# Patient Record
Sex: Male | Born: 1955 | Race: White | Hispanic: No | Smoking: Never smoker
Health system: Southern US, Community
[De-identification: ages and names within clinical notes are randomized; demographics above are authoritative.]

## PROBLEM LIST (undated history)

## (undated) DIAGNOSIS — G4730 Sleep apnea, unspecified: Secondary | ICD-10-CM

## (undated) DIAGNOSIS — M109 Gout, unspecified: Secondary | ICD-10-CM

## (undated) DIAGNOSIS — C44320 Squamous cell carcinoma of skin of unspecified parts of face: Secondary | ICD-10-CM

## (undated) DIAGNOSIS — I493 Ventricular premature depolarization: Secondary | ICD-10-CM

## (undated) DIAGNOSIS — I10 Essential (primary) hypertension: Secondary | ICD-10-CM

## (undated) DIAGNOSIS — K219 Gastro-esophageal reflux disease without esophagitis: Secondary | ICD-10-CM

## (undated) HISTORY — DX: Squamous cell carcinoma of skin of unspecified parts of face: C44.320

---
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
## 2008-09-22 ENCOUNTER — Inpatient Hospital Stay: Payer: Self-pay | Admitting: Internal Medicine

## 2008-09-22 ENCOUNTER — Other Ambulatory Visit: Payer: Self-pay

## 2010-08-14 ENCOUNTER — Ambulatory Visit: Payer: Self-pay | Admitting: Internal Medicine

## 2020-01-30 ENCOUNTER — Ambulatory Visit: Payer: Medicare PPO | Attending: Internal Medicine

## 2020-01-30 DIAGNOSIS — Z23 Encounter for immunization: Secondary | ICD-10-CM | POA: Insufficient documentation

## 2020-01-30 NOTE — Progress Notes (Signed)
   Covid-19 Vaccination Clinic  Name:  Douglas Daugherty    MRN: [id number] DOB: 07/03/1956  01/30/2020  [name] was observed post Covid-19 immunization for 15 minutes without incidence. He was provided with Vaccine Information Sheet and instruction to access the V-Safe system.   [name] was instructed to call 911 with any severe reactions post vaccine: Marland Kitchen Difficulty breathing  . Swelling of your face and throat  . A fast heartbeat  . A bad rash all over your body  . Dizziness and weakness    Immunizations Administered    Name Date Dose VIS Date Route   Pfizer COVID-19 Vaccine 01/30/2020 10:24 AM 0.3 mL 11/27/2019 Intramuscular   Manufacturer: [place]   Lot: [phone] [id number]   [place]: [id number]

## 2020-02-05 ENCOUNTER — Other Ambulatory Visit: Payer: Self-pay | Admitting: Internal Medicine

## 2020-02-05 DIAGNOSIS — E213 Hyperparathyroidism, unspecified: Secondary | ICD-10-CM

## 2020-02-12 ENCOUNTER — Encounter
Admission: RE | Admit: 2020-02-12 | Discharge: 2020-02-12 | Disposition: A | Discharge status: Home / Self Care | Payer: Medicare PPO | Source: Ambulatory Visit | Attending: Internal Medicine | Admitting: Internal Medicine

## 2020-02-12 ENCOUNTER — Other Ambulatory Visit: Payer: Self-pay

## 2020-02-12 DIAGNOSIS — E213 Hyperparathyroidism, unspecified: Secondary | ICD-10-CM | POA: Diagnosis present

## 2020-02-12 MED ORDER — TECHNETIUM TC 99M SESTAMIBI GENERIC - CARDIOLITE
25.0000 | Freq: Once | INTRAVENOUS | Status: AC | PRN
  Administered 2020-02-12: 12:00:00 24.77 via INTRAVENOUS

## 2020-02-20 ENCOUNTER — Ambulatory Visit: Payer: Medicare PPO | Attending: Internal Medicine

## 2020-02-20 DIAGNOSIS — Z23 Encounter for immunization: Secondary | ICD-10-CM | POA: Insufficient documentation

## 2020-02-20 NOTE — Progress Notes (Signed)
   Covid-19 Vaccination Clinic  Name:  Douglas Daugherty    MRN: [id number] DOB: 1956/06/26  02/20/2020  Mr. Douglas Daugherty was observed post Covid-19 immunization for 15 minutes without incident. He was provided with Vaccine Information Sheet and instruction to access the V-Safe system.   Douglas Daugherty was instructed to call 911 with any severe reactions post vaccine: Marland Kitchen Difficulty breathing  . Swelling of face and throat  . A fast heartbeat  . A bad rash all over body  . Dizziness and weakness   Immunizations Administered    Name Date Dose VIS Date Route   Pfizer COVID-19 Vaccine 02/20/2020  9:31 AM 0.3 mL 11/27/2019 Intramuscular   Manufacturer: [place]   Lot: [id number]   [place]: [id number]

## 2020-03-25 ENCOUNTER — Other Ambulatory Visit
Admission: RE | Admit: 2020-03-25 | Discharge: 2020-03-25 | Disposition: A | Discharge status: Home / Self Care | Payer: Medicare PPO | Source: Ambulatory Visit | Attending: Unknown Physician Specialty | Admitting: Unknown Physician Specialty

## 2020-03-25 DIAGNOSIS — Z20822 Contact with and (suspected) exposure to covid-19: Secondary | ICD-10-CM | POA: Insufficient documentation

## 2020-03-25 DIAGNOSIS — Z01812 Encounter for preprocedural laboratory examination: Secondary | ICD-10-CM | POA: Diagnosis present

## 2020-03-25 LAB — SARS CORONAVIRUS 2 (TAT 6-24 HRS): SARS Coronavirus 2: NEGATIVE

## 2020-03-28 ENCOUNTER — Encounter
Admission: RE | Admit: 2020-03-28 | Discharge: 2020-03-28 | Disposition: A | Discharge status: Home / Self Care | Payer: Medicare PPO | Source: Ambulatory Visit | Attending: Unknown Physician Specialty | Admitting: Unknown Physician Specialty

## 2020-03-28 ENCOUNTER — Other Ambulatory Visit: Payer: Self-pay

## 2020-03-28 HISTORY — DX: Essential (primary) hypertension: I10

## 2020-03-28 HISTORY — DX: Sleep apnea, unspecified: G47.30

## 2020-03-28 HISTORY — DX: Gout, unspecified: M10.9

## 2020-03-28 HISTORY — DX: Ventricular premature depolarization: I49.3

## 2020-03-28 HISTORY — DX: Gastro-esophageal reflux disease without esophagitis: K21.9

## 2020-03-28 NOTE — Patient Instructions (Signed)
Your procedure is scheduled on: Tuesday 03/29/2020 Report to [facility] elevator on left to 2nd floor.  Check in with surgery information desk.) To find out your arrival time, call [phone] 1:00-3:00 PM today  Remember: Instructions that are not followed completely may result in serious medical risk, up to and including death, or upon the discretion of your surgeon and anesthesiologist your surgery may need to be rescheduled.   __x__ 1. Do not eat food (including mints, candies, chewing gum) after midnight the night before your procedure. You may drink clear liquids up to 2 hours before you are scheduled to arrive at the hospital for your procedure.  Do not drink anything within 2 hours of your scheduled arrival to the hospital.  Approved clear liquids:  --Water or Apple juice without pulp  --Clear carbohydrate beverage such as Gatorade or Powerade  --Black Coffee or Clear Tea (No milk, no creamers, do not add anything to the coffee or tea)   __x__ 2. No alcohol or smoking for 24 hours before or after surgery.  __x__ 3. Notify your doctor if there is any change in your medical condition (cold, fever, infections).  __x__ 4. On the morning of surgery brush your teeth with toothpaste and water.  You may rinse your mouth with mouthwash if you wish.  Do not swallow any toothpaste or mouthwash.  __x__ If available, use antibacterial soap such as Dial to shower/bathe on the day of surgery.  Do not wear jewelry, lotions, powders, deodorant or perfumes on the day of surgery. Do not shave below the face/neck 48 hours prior to surgery.  Do not bring valuables to the hospital.  [facility] is not responsible for any belongings or valuables.   Eyeglasses may not be worn into surgery. For patients discharged on the day of surgery, you will NOT be permitted to drive yourself home.  You must have a responsible adult with you for 24 hours after surgery.  __x__ Take  these medicines on the morning of surgery with a SMALL SIP OF WATER:  1. Carvedilol (Coreg)  2. Omeprazole (Prilosec)  3. Allopurinol (Zyloprim)  4. Tylenol if needed  5. Ativan if needed  __x__ [facility] your morning dose of Losartan and Hydrochlorothiazide only on the day of surgery.  No need to stop these ahead of time.   _n/a_ Follow recommendations from Cardiologist, Pulmonologist or PCP regarding stopping blood thinners.  __x__ TODAY UNTIL AFTER SURGERY: Do not take any Anti-inflammatories such as Advil, Ibuprofen, Motrin, Aleve, Naproxen, Naprosyn, BC/Goodies powders or aspirin products. You may continue to take Tylenol as needed.   __x__ TODAY UNTIL AFTER SURGERY: Do not take any over the counter herbal/ nutritional supplements/ vitamins.  RN reviewed instructions with patient via preop phone call.  Patient verbalized understanding.

## 2020-03-29 ENCOUNTER — Encounter: Payer: Self-pay | Admitting: Unknown Physician Specialty

## 2020-03-29 ENCOUNTER — Ambulatory Visit
Admission: RE | Admit: 2020-03-29 | Discharge: 2020-03-29 | Disposition: A | Discharge status: Home / Self Care | Payer: Medicare PPO | Attending: Unknown Physician Specialty | Admitting: Unknown Physician Specialty

## 2020-03-29 ENCOUNTER — Ambulatory Visit: Payer: Medicare PPO | Admitting: Anesthesiology

## 2020-03-29 ENCOUNTER — Encounter
Admission: RE | Disposition: A | Discharge status: Home / Self Care | Payer: Self-pay | Source: Home / Self Care | Attending: Unknown Physician Specialty

## 2020-03-29 DIAGNOSIS — K219 Gastro-esophageal reflux disease without esophagitis: Secondary | ICD-10-CM | POA: Diagnosis not present

## 2020-03-29 DIAGNOSIS — M109 Gout, unspecified: Secondary | ICD-10-CM | POA: Diagnosis not present

## 2020-03-29 DIAGNOSIS — Z881 Allergy status to other antibiotic agents status: Secondary | ICD-10-CM | POA: Insufficient documentation

## 2020-03-29 DIAGNOSIS — E785 Hyperlipidemia, unspecified: Secondary | ICD-10-CM | POA: Insufficient documentation

## 2020-03-29 DIAGNOSIS — Z79899 Other long term (current) drug therapy: Secondary | ICD-10-CM | POA: Insufficient documentation

## 2020-03-29 DIAGNOSIS — D351 Benign neoplasm of parathyroid gland: Secondary | ICD-10-CM | POA: Diagnosis not present

## 2020-03-29 DIAGNOSIS — I10 Essential (primary) hypertension: Secondary | ICD-10-CM | POA: Diagnosis not present

## 2020-03-29 DIAGNOSIS — G4730 Sleep apnea, unspecified: Secondary | ICD-10-CM | POA: Diagnosis not present

## 2020-03-29 DIAGNOSIS — E214 Other specified disorders of parathyroid gland: Secondary | ICD-10-CM | POA: Diagnosis present

## 2020-03-29 HISTORY — PX: PARATHYROIDECTOMY: SHX19

## 2020-03-29 LAB — PARATHYROID HORMONE, INTRAOP (ARMC ONLY)
Parathyroid Hormone: 147 pg/mL — ABNORMAL HIGH (ref 12–88)
Parathyroid Hormone: 34 pg/mL (ref 12–88)

## 2020-03-29 LAB — CBC
HCT: 46.1 % (ref 39.0–52.0)
Hemoglobin: 16.7 g/dL (ref 13.0–17.0)
MCH: 32.9 pg (ref 26.0–34.0)
MCHC: 36.2 g/dL — ABNORMAL HIGH (ref 30.0–36.0)
MCV: 90.9 fL (ref 80.0–100.0)
Platelets: 291 10*3/uL (ref 150–400)
RBC: 5.07 MIL/uL (ref 4.22–5.81)
RDW: 12.8 % (ref 11.5–15.5)
WBC: 8.6 10*3/uL (ref 4.0–10.5)
nRBC: 0 % (ref 0.0–0.2)

## 2020-03-29 LAB — BASIC METABOLIC PANEL
Anion gap: 11 (ref 5–15)
BUN: 16 mg/dL (ref 8–23)
CO2: 26 mmol/L (ref 22–32)
Calcium: 10.3 mg/dL (ref 8.9–10.3)
Chloride: 102 mmol/L (ref 98–111)
Creatinine, Ser: 1.13 mg/dL (ref 0.61–1.24)
GFR calc Af Amer: >60 mL/min (ref >60)
GFR calc non Af Amer: >60 mL/min (ref >60)
Glucose, Bld: 142 mg/dL — ABNORMAL HIGH (ref 70–99)
Potassium: 3.3 mmol/L — ABNORMAL LOW (ref 3.5–5.1)
Sodium: 139 mmol/L (ref 135–145)

## 2020-03-29 SURGERY — PARATHYROIDECTOMY
Anesthesia: General | Laterality: Left

## 2020-03-29 MED ORDER — PROPOFOL 500 MG/50ML IV EMUL
INTRAVENOUS | Status: AC
  Filled 2020-03-29: qty 50

## 2020-03-29 MED ORDER — GLYCOPYRROLATE 0.2 MG/ML IJ SOLN
INTRAMUSCULAR | Status: DC | PRN
  Administered 2020-03-29 (×2): .2 mg via INTRAVENOUS

## 2020-03-29 MED ORDER — DEXAMETHASONE SODIUM PHOSPHATE 10 MG/ML IJ SOLN
INTRAMUSCULAR | Status: DC | PRN
  Administered 2020-03-29: 10 mg via INTRAVENOUS

## 2020-03-29 MED ORDER — LACTATED RINGERS IV SOLN: INTRAVENOUS | Status: DC | PRN

## 2020-03-29 MED ORDER — LIDOCAINE-EPINEPHRINE 1 %-1:100000 IJ SOLN
INTRAMUSCULAR | Status: AC
  Filled 2020-03-29: qty 1

## 2020-03-29 MED ORDER — PROPOFOL 500 MG/50ML IV EMUL
INTRAVENOUS | Status: DC | PRN
  Administered 2020-03-29: 140 ug/kg/min via INTRAVENOUS

## 2020-03-29 MED ORDER — EPHEDRINE SULFATE 50 MG/ML IJ SOLN
INTRAMUSCULAR | Status: DC | PRN
  Administered 2020-03-29: 10 mg via INTRAVENOUS
  Administered 2020-03-29: 15 mg via INTRAVENOUS

## 2020-03-29 MED ORDER — ONDANSETRON HCL 4 MG/2ML IJ SOLN
INTRAMUSCULAR | Status: DC | PRN
  Administered 2020-03-29: 4 mg via INTRAVENOUS

## 2020-03-29 MED ORDER — REMIFENTANIL HCL 1 MG IV SOLR
INTRAVENOUS | Status: AC
  Filled 2020-03-29: qty 1000

## 2020-03-29 MED ORDER — SODIUM CHLORIDE 0.9 % IV SOLN
INTRAVENOUS | Status: DC | PRN
  Administered 2020-03-29: .2 ug/kg/min via INTRAVENOUS

## 2020-03-29 MED ORDER — PHENYLEPHRINE HCL (PRESSORS) 10 MG/ML IV SOLN
INTRAVENOUS | Status: DC | PRN
  Administered 2020-03-29: 100 ug via INTRAVENOUS

## 2020-03-29 MED ORDER — PHENYLEPHRINE HCL (PRESSORS) 10 MG/ML IV SOLN
INTRAVENOUS | Status: DC | PRN
  Administered 2020-03-29 (×2): 100 ug via INTRAVENOUS

## 2020-03-29 MED ORDER — HYDROCODONE-ACETAMINOPHEN 5-300 MG PO TABS: 1.0000 | ORAL_TABLET | ORAL | 0 refills | Status: DC | PRN

## 2020-03-29 MED ORDER — FENTANYL CITRATE (PF) 100 MCG/2ML IJ SOLN
INTRAMUSCULAR | Status: AC
  Filled 2020-03-29: qty 2

## 2020-03-29 MED ORDER — MIDAZOLAM HCL 2 MG/2ML IJ SOLN
INTRAMUSCULAR | Status: AC
  Filled 2020-03-29: qty 2

## 2020-03-29 MED ORDER — LIDOCAINE-EPINEPHRINE 1 %-1:100000 IJ SOLN
INTRAMUSCULAR | Status: DC | PRN
  Administered 2020-03-29: 3 mL

## 2020-03-29 MED ORDER — PROPOFOL 10 MG/ML IV BOLUS
INTRAVENOUS | Status: DC | PRN
  Administered 2020-03-29: 70 mg via INTRAVENOUS
  Administered 2020-03-29: 180 mg via INTRAVENOUS

## 2020-03-29 MED ORDER — PROPOFOL 10 MG/ML IV BOLUS
INTRAVENOUS | Status: AC
  Filled 2020-03-29: qty 20

## 2020-03-29 MED ORDER — FENTANYL CITRATE (PF) 100 MCG/2ML IJ SOLN
INTRAMUSCULAR | Status: DC | PRN
  Administered 2020-03-29: 100 ug via INTRAVENOUS

## 2020-03-29 MED ORDER — LIDOCAINE HCL (CARDIAC) PF 100 MG/5ML IV SOSY
PREFILLED_SYRINGE | INTRAVENOUS | Status: DC | PRN
  Administered 2020-03-29: 100 mg via INTRAVENOUS

## 2020-03-29 MED ORDER — MIDAZOLAM HCL 2 MG/2ML IJ SOLN
INTRAMUSCULAR | Status: DC | PRN
  Administered 2020-03-29 (×2): 1 mg via INTRAVENOUS

## 2020-03-29 MED ORDER — SUCCINYLCHOLINE CHLORIDE 20 MG/ML IJ SOLN
INTRAMUSCULAR | Status: DC | PRN
  Administered 2020-03-29: 160 mg via INTRAVENOUS

## 2020-03-29 MED ORDER — LACTATED RINGERS IV SOLN: INTRAVENOUS | Status: DC

## 2020-03-29 SURGICAL SUPPLY — 45 items
BLADE SURG 15 STRL LF DISP TIS (BLADE) ×1 IMPLANT
BLADE SURG 15 STRL SS (BLADE) ×2
CANISTER SUCT 1200ML W/VALVE (MISCELLANEOUS) ×3 IMPLANT
CNTNR SPEC 2.5X3XGRAD LEK (MISCELLANEOUS) ×1
CONT SPEC 4OZ STER OR WHT (MISCELLANEOUS) ×2
CONTAINER SPEC 2.5X3XGRAD LEK (MISCELLANEOUS) ×1 IMPLANT
COVER WAND RF STERILE (DRAPES) ×3 IMPLANT
DERMABOND ADVANCED (GAUZE/BANDAGES/DRESSINGS) ×2
DERMABOND ADVANCED .7 DNX12 (GAUZE/BANDAGES/DRESSINGS) IMPLANT
DRAPE MAG INST 16X20 L/F (DRAPES) ×3 IMPLANT
DRSG TEGADERM 2-3/8X2-3/4 SM (GAUZE/BANDAGES/DRESSINGS) ×3 IMPLANT
ELECT CAUTERY BLADE TIP 2.5 (TIP) ×3
ELECT LARYNGEAL 6/7 (MISCELLANEOUS)
ELECT LARYNGEAL 8/9 (MISCELLANEOUS) ×3
ELECT REM PT RETURN 9FT ADLT (ELECTROSURGICAL) ×3
ELECTRODE CAUTERY BLDE TIP 2.5 (TIP) ×1 IMPLANT
ELECTRODE LARYNGEAL 6/7 (MISCELLANEOUS) ×1 IMPLANT
ELECTRODE LARYNGEAL 8/9 (MISCELLANEOUS) ×1 IMPLANT
ELECTRODE REM PT RTRN 9FT ADLT (ELECTROSURGICAL) ×1 IMPLANT
FORCEPS JEWEL BIP 4-3/4 STR (INSTRUMENTS) ×3 IMPLANT
GAUZE 4X4 16PLY RFD (DISPOSABLE) ×3 IMPLANT
GLOVE BIO SURGEON STRL SZ7.5 (GLOVE) ×6 IMPLANT
GOWN STRL REUS W/ TWL LRG LVL3 (GOWN DISPOSABLE) ×1 IMPLANT
GOWN STRL REUS W/TWL LRG LVL3 (GOWN DISPOSABLE) ×2
HEMOSTAT SURGICEL 2X3 (HEMOSTASIS) ×3 IMPLANT
HOOK STAY BLUNT/RETRACTOR 5M (MISCELLANEOUS) ×3 IMPLANT
JACKSON PRATT 7MM (INSTRUMENTS) ×3 IMPLANT
KIT TURNOVER KIT A (KITS) ×3 IMPLANT
LABEL OR SOLS (LABEL) ×3 IMPLANT
MARKER SKIN DUAL TIP RULER LAB (MISCELLANEOUS) ×3 IMPLANT
NS IRRIG 500ML POUR BTL (IV SOLUTION) ×3 IMPLANT
PACK HEAD/NECK (MISCELLANEOUS) ×3 IMPLANT
PROBE NEUROSIGN BIPOL (MISCELLANEOUS) ×1 IMPLANT
PROBE NEUROSIGN BIPOLAR (MISCELLANEOUS) ×2
SHEARS HARMONIC 9CM CVD (BLADE) ×3 IMPLANT
SPONGE KITTNER 5P (MISCELLANEOUS) ×3 IMPLANT
STAPLER SKIN PROX 35W (STAPLE) ×3 IMPLANT
SUT ETH BLK MONO 3 0 FS 1 12/B (SUTURE) ×3 IMPLANT
SUT PROLENE 5 0 PS 3 (SUTURE) ×3 IMPLANT
SUT SILK 2 0 (SUTURE) ×2
SUT SILK 2 0 SH (SUTURE) ×6 IMPLANT
SUT SILK 2-0 18XBRD TIE 12 (SUTURE) ×1 IMPLANT
SUT SILK 3 0 (SUTURE) ×2
SUT SILK 3-0 18XBRD TIE 12 (SUTURE) ×1 IMPLANT
SUT VIC AB 4-0 RB1 18 (SUTURE) ×3 IMPLANT

## 2020-03-29 NOTE — Anesthesia Procedure Notes (Signed)
Procedure Name: Intubation Date/Time: 03/29/2020 8:12 AM Performed by: Arita Miss, MD Pre-anesthesia Checklist: Patient identified, Patient being monitored, Timeout performed, Emergency Drugs available and Suction available Patient Re-evaluated:Patient Re-evaluated prior to induction Oxygen Delivery Method: Circle system utilized Preoxygenation: Pre-oxygenation with 100% oxygen Induction Type: IV induction and Rapid sequence Laryngoscope Size: McGraph and 4 Grade View: Grade I Tube type: Oral Tube size: 7.0 mm Number of attempts: 1 Airway Equipment and Method: Stylet (Nerve monitoring sticker applied to ETT and inserted with ENT visualizing insertion) Placement Confirmation: ETT inserted through vocal cords under direct vision,  positive ETCO2 and breath sounds checked- equal and bilateral Secured at: 22 cm Tube secured with: Tape Dental Injury: Teeth and Oropharynx as per pre-operative assessment  Comments: MV not attempted

## 2020-03-29 NOTE — Transfer of Care (Signed)
Immediate Anesthesia Transfer of Care Note  Patient: Douglas Daugherty  Procedure(s) Performed: PARATHYROIDECTOMY (Left )  Patient Location: PACU  Anesthesia Type:General  Level of Consciousness: sedated  Airway & Oxygen Therapy: Patient Spontanous Breathing and Patient connected to face mask oxygen  Post-op Assessment: Report given to RN and Post -op Vital signs reviewed and stable  Post vital signs: Reviewed and stable  Last Vitals:  Vitals Value Taken Time  BP 150/89 03/29/20 0948  Temp 36.6 C 03/29/20 0948  Pulse 81 03/29/20 0948  Resp 16 03/29/20 0948  SpO2 94 % 03/29/20 0948  Vitals shown include unvalidated device data.  Last Pain:  Vitals:   03/29/20 0948  TempSrc:   PainSc: 0-No pain         Complications: No apparent anesthesia complications

## 2020-03-29 NOTE — Anesthesia Preprocedure Evaluation (Signed)
Anesthesia Evaluation  Patient identified by MRN, date of birth, ID band Patient awake    Reviewed: Allergy & Precautions, NPO status , Patient's Chart, lab work & pertinent test results  History of Anesthesia Complications Negative for: history of anesthetic complications  Airway Mallampati: III  TM Distance: >3 FB Neck ROM: Full    Dental no notable dental hx. (+) Teeth Intact, Dental Advisory Given   Pulmonary neg pulmonary ROS, sleep apnea and Continuous Positive Airway Pressure Ventilation , neg COPD, Patient abstained from smoking.Not current smoker,    Pulmonary exam normal breath sounds clear to auscultation       Cardiovascular Exercise Tolerance: Good METShypertension, Pt. on medications (-) CAD and (-) Past MI negative cardio ROS  (-) dysrhythmias  Rhythm:Regular Rate:Normal - Systolic murmurs RBBB and LAFB   Neuro/Psych negative neurological ROS  negative psych ROS   GI/Hepatic GERD  Controlled,(+)     (-) substance abuse  ,   Endo/Other  neg diabetes  Renal/GU negative Renal ROS     Musculoskeletal   Abdominal   Peds  Hematology   Anesthesia Other Findings Past Medical History: No date: GERD (gastroesophageal reflux disease) No date: Gout No date: Hypertension No date: PVC (premature ventricular contraction) No date: Sleep apnea  Reproductive/Obstetrics                             Anesthesia Physical Anesthesia Plan  ASA: III  Anesthesia Plan: General   Post-op Pain Management:    Induction: Intravenous  PONV Risk Score and Plan: 3 and Ondansetron, Dexamethasone and Propofol infusion  Airway Management Planned: Oral ETT and Video Laryngoscope Planned  Additional Equipment: None  Intra-op Plan:   Post-operative Plan: Extubation in OR  Informed Consent: I have reviewed the patients History and Physical, chart, labs and discussed the procedure including the  risks, benefits and alternatives for the proposed anesthesia with the patient or authorized representative who has indicated his/her understanding and acceptance.     Dental advisory given  Plan Discussed with: CRNA and Surgeon  Anesthesia Plan Comments: (Discussed risks of anesthesia with patient, including PONV, sore throat, lip/dental damage. Rare risks discussed as well, such as cardiorespiratory and neurological sequelae. Patient understands.)        Anesthesia Quick Evaluation

## 2020-03-29 NOTE — Op Note (Signed)
03/29/2020  9:41 AM    Kristen Cardinal  [id number]   Pre-Op Dx: parathyroid mass  Post-op Dx: SAME  Proc: Recurrent laryngeal nerve monitoring proximately 1 hour; excision left superior parathyroid adenoma  Surg:  Roena Malady   Assistant: Vought  Anes:  GOT  EBL: Less than 10 cc  Comp: None  Findings: Approximately 1.5 x 2.5 cm left superior pole parathyroid adenoma  Procedure: Eulis was identified in the holding area taken the operating placed in supine position.  He was then intubated with a laryngeal nerve monitoring endotracheal tube which remained on throughout the procedure.  Blood was drawn for intraoperative PTH level at the beginning of the case this was 147.  The neck was gently extended and the anterior neck was prepped and draped sterilely.  Incision line was marked overlying the cricoid cartilage.  A local anesthetic of 1% lidocaine with 1 100,000 epinephrine was used to inject over the incision line a total of 3 cc was used.  With the neck prepped and draped sterilely a 15 blade was used to incise down to and through the platysma muscle.  Hemostasis was achieved using the harmonic and the Bovie cautery.  The strap muscles were identified in the midline and divided.  On the left-hand side the strap muscles were retracted laterally and the left lobe of the thyroid was identified.  There were several small feeding vessels which were divided using the harmonic scalpel.  The gland was gently medialized.  The recurrent laryngeal nerve was identified in the tracheoesophageal groove was stimulated and remained intact throughout the procedure.  A large mass measuring approximately 1.5 x 2.5 cm was identified in the region behind the upper pole of the left thyroid lobe.  This mass was gently dissected free from the surrounding tissue.  There was several small feeding veins which were divided using harmonic scalpel.  This nodule was removed in its entirety and sent for frozen  section.  Blood was then drawn for intraoperative PTH monitoring after 10 minutes following the removal of the nodule.  This result came back 34.  The frozen section came back hypercellular parathyroid tissue consistent with adenoma.  At this point we felt confident the adenoma was removed the wound was copiously irrigated with saline and any small bleeding points were cauterized using the microbipolar.  The recurrent laryngeal nerve was stimulated at the end of the case and remained intact.  With no active bleeding Surgicel was packed in the neurovascular bed.  The muscles were reapproximated midline using 4-0 Vicryl.  The platysma and subcutaneous tissues were closed using 4-0 Vicryl and the skin was closed using Dermabond.  The patient tolerated the procedure well was awakened in the operating take care in stable condition.  Specimen: Left superior pole parathyroid adenoma  Dispo:   Good  Plan: Discharged home follow-up 1 week.  Roena Malady  03/29/2020 9:41 AM

## 2020-03-29 NOTE — Discharge Instructions (Signed)

## 2020-03-29 NOTE — H&P (Signed)
The patient's history has been reviewed, patient examined, no change in status, stable for surgery.  Questions were answered to the patients satisfaction.  

## 2020-03-29 NOTE — Anesthesia Postprocedure Evaluation (Signed)
Anesthesia Post Note  Patient: Douglas Daugherty  Procedure(s) Performed: PARATHYROIDECTOMY (Left )  Patient location during evaluation: PACU Anesthesia Type: General Level of consciousness: awake and alert Pain management: pain level controlled Vital Signs Assessment: post-procedure vital signs reviewed and stable Respiratory status: spontaneous breathing, nonlabored ventilation, respiratory function stable and patient connected to nasal cannula oxygen Cardiovascular status: blood pressure returned to baseline and stable Postop Assessment: no apparent nausea or vomiting Anesthetic complications: no     Last Vitals:  Vitals:   03/29/20 0957 03/29/20 1018  BP: 135/88   Pulse: 83 78  Resp: 16 16  Temp: (!) 36.3 C   SpO2: 96% 95%    Last Pain:  Vitals:   03/29/20 0957  TempSrc: Tympanic  PainSc: 0-No pain                 Arita Miss

## 2020-03-30 LAB — SURGICAL PATHOLOGY

## 2020-08-15 ENCOUNTER — Ambulatory Visit (INDEPENDENT_AMBULATORY_CARE_PROVIDER_SITE_OTHER): Payer: Medicare PPO | Admitting: Internal Medicine

## 2020-08-15 DIAGNOSIS — Z6838 Body mass index (BMI) 38.0-38.9, adult: Secondary | ICD-10-CM | POA: Diagnosis not present

## 2020-08-15 DIAGNOSIS — I10 Essential (primary) hypertension: Secondary | ICD-10-CM | POA: Insufficient documentation

## 2020-08-15 DIAGNOSIS — Z9989 Dependence on other enabling machines and devices: Secondary | ICD-10-CM

## 2020-08-15 DIAGNOSIS — G4733 Obstructive sleep apnea (adult) (pediatric): Secondary | ICD-10-CM | POA: Diagnosis not present

## 2020-08-15 NOTE — Progress Notes (Signed)
[facility] [place], [place] [address]  Pulmonary Sleep Medicine   Office Visit Note  Patient Name: Douglas Daugherty DOB: 1956/01/25 MRN [id number]    Chief Complaint: Obstructive Sleep Apnea visit  Brief History:  Douglas Daugherty is seen today for follow-up. The patient has a 12 year history of sleep apnea. Patient is using PAP nightly.  The patient feels more rested after sleeping with PAP.  The patient reports  benefiting from PAP use. Reported sleepiness is improved and the Epworth Sleepiness Score is 0 out of 24. The patient does not take naps. The patient complains of the following: no complaints  The compliance download shows  compliance with an average use time of 8.6hours. The AHI is 0.9 The patient does not complain of limb movements disrupting sleep.  ROS  General: (-) fever, (-) chills, (-) night sweat Nose and Sinuses: (-) nasal stuffiness or itchiness, (-) postnasal drip, (-) nosebleeds, (-) sinus trouble. Mouth and Throat: (-) sore throat, (-) hoarseness. Neck: (-) swollen glands, (-) enlarged thyroid, (-) neck pain. Respiratory:-cough, - shortness of breath, - wheezing. Neurologic: - numbness, - tingling. Psychiatric: - anxiety, -depression   Current Medication: Outpatient Encounter Medications as of 08/15/2020  Medication Sig  . acetaminophen (TYLENOL) 500 MG tablet Take 1,000 mg by mouth every 6 (six) hours as needed for moderate pain or headache.  . allopurinol (ZYLOPRIM) 100 MG tablet Take 100 mg by mouth daily.  . Carboxymethylcellulose Sodium (ARTIFICIAL TEARS OP) Place 1 drop into both eyes daily as needed (dry eyes).  . carvedilol (COREG) 12.5 MG tablet Take 25 mg by mouth 2 (two) times daily with a meal.   . colchicine 0.6 MG tablet Take 0.6 mg by mouth 2 (two) times daily as needed (gout flare).   . hydrochlorothiazide (HYDRODIURIL) 25 MG tablet Take 25 mg by mouth daily.  Marland Kitchen HYDROcodone-Acetaminophen 5-300 MG TABS Take 1-2 tablets by  mouth every 4 (four) hours as needed.  Marland Kitchen LORazepam (ATIVAN) 0.5 MG tablet Take 0.5 mg by mouth at bedtime as needed for sleep.  Marland Kitchen losartan (COZAAR) 50 MG tablet Take 100 mg by mouth daily.  . Multiple Vitamin (MULTIVITAMIN WITH MINERALS) TABS tablet Take 1 tablet by mouth daily.  . naproxen sodium (ALEVE) 220 MG tablet Take 220 mg by mouth 2 (two) times daily as needed (pain).  Marland Kitchen ofloxacin (OCUFLOX) 0.3 % ophthalmic solution Place 2 drops into both eyes daily as needed (infection).   Marland Kitchen omeprazole (PRILOSEC) 20 MG capsule Take 20 mg by mouth daily.  . prednisoLONE acetate (PRED FORTE) 1 % ophthalmic suspension Place 1 drop into both eyes daily as needed (allergies).   No facility-administered encounter medications on file as of 08/15/2020.    Surgical History: Past Surgical History:  Procedure Laterality Date  . KNEE SURGERY  1974  . PARATHYROIDECTOMY Left 03/29/2020   Procedure: PARATHYROIDECTOMY;  Surgeon: Beverly Gust, MD;  Location: [facility] ORS;  Service: ENT;  Laterality: Left;    Medical History: Past Medical History:  Diagnosis Date  . GERD (gastroesophageal reflux disease)   . Gout   . Hypertension   . PVC (premature ventricular contraction)   . Sleep apnea     Family History: Non contributory to the present illness  Social History: Social History   Socioeconomic History  . Marital status: Widowed    Spouse name: Not on file  . Number of children: Not on file  . Years of education: Not on file  . Highest education level:  Not on file  Occupational History  . Not on file  Tobacco Use  . Smoking status: Never Smoker  . Smokeless tobacco: Never Used  Substance and Sexual Activity  . Alcohol use: Yes    Comment: occasional  . Drug use: Never  . Sexual activity: Not on file  Other Topics Concern  . Not on file  Social History Narrative  . Not on file   Social Determinants of Health   Financial Resource Strain:   . Difficulty of Paying Living Expenses: Not  on file  Food Insecurity:   . Worried About Charity fundraiser in the Last Year: Not on file  . Ran Out of Food in the Last Year: Not on file  Transportation Needs:   . Lack of Transportation (Medical): Not on file  . Lack of Transportation (Non-Medical): Not on file  Physical Activity:   . Days of Exercise per Week: Not on file  . Minutes of Exercise per Session: Not on file  Stress:   . Feeling of Stress : Not on file  Social Connections:   . Frequency of Communication with Friends and Family: Not on file  . Frequency of Social Gatherings with Friends and Family: Not on file  . Attends Religious Services: Not on file  . Active Member of Clubs or Organizations: Not on file  . Attends Archivist Meetings: Not on file  . Marital Status: Not on file  Intimate Partner Violence:   . Fear of Current or Ex-Partner: Not on file  . Emotionally Abused: Not on file  . Physically Abused: Not on file  . Sexually Abused: Not on file    Vital Signs: Blood pressure (!) 173/105, pulse 66, temperature 98.1 F (36.7 C), height 6' (1.829 m), weight 284 lb (128.8 kg), SpO2 98 %.  Examination: General Appearance: The patient is well-developed, well-nourished, and in no distress. Neck Circumference: 45 Skin: Gross inspection of skin unremarkable. Head: normocephalic, no gross deformities. Eyes: no gross deformities noted. ENT: ears appear grossly normal Neurologic: Alert and oriented. No involuntary movements.    EPWORTH SLEEPINESS SCALE:  Scale:  (0)= no chance of dozing; (1)= slight chance of dozing; (2)= moderate chance of dozing; (3)= high chance of dozing  Chance  Situtation    Sitting and reading: 0    Watching TV: 0    Sitting Inactive in public: 0    As a passenger in car: 0     Lying down to rest: 0    Sitting and talking: 0    Sitting quielty after lunch: 0    In a car, stopped in traffic: 0   TOTAL SCORE:   0 out of 24    SLEEP STUDIES:  1. PSG  10/07/08 AHI 76 SpO89min80%   CPAP COMPLIANCE DATA:  Date Range:08/12/19-08/10/20  Average Daily Use: 8.6 hours  Median Use: 8.7  Compliance for > 4 Hours: 100% AHI: 0.9respiratory events per hour  Days Use 365/365  Mask Leak: 11.2  95th Percentile Pressure: 15.5        LABS: No results found for this or any previous visit (from the past 2160 hour(s)).  Radiology: No results found.  Assessment and Plan: Patient Active Problem List   Diagnosis Date Noted  . OSA on CPAP 08/15/2020  . Hypertension 08/15/2020  . BMI 38.0-38.9,adult 08/15/2020    The patient does tolerate PAP and reports significant benefit from PAP use. He sleeps well and has no issues with CPAP.The patient  was reminded how to clean  CPAP. The patient was also counselled on watching diet and increasing daily exercise. The compliance is excellent. The AHI is 0.9.   1. OSA continue excellent compliance and care for machine. 2. Obesity- increase regular exercise 3. Hypertension-Elevated BP reading today. Encouraged him to monitor his BP at home and to follow-up with PCP as advised.  General Counseling: I have discussed the findings of the evaluation and examination with Lanny Hurst.  I have also discussed any further diagnostic evaluation thatmay be needed or ordered today. Joahan verbalizes understanding of the findings of todays visit. We also reviewed his medications today and discussed drug interactions and side effects including but not limited excessive drowsiness and altered mental states. We also discussed that there is always a risk not just to him but also people around him. he has been encouraged to call the office with any questions or concerns that should arise related to todays visit.     This patient was seen by Theodoro Grist AGNP-C in Collaboration with Dr. Devona Konig as a part of collaborative care agreement.   I have personally obtained a history, examined the patient, evaluated laboratory and  imaging results, formulated the assessment and plan and placed orders.   Richelle Ito Saunders Glance, PhD, FAASM  Diplomate, American Board of Sleep Medicine    Allyne Gee, MD [facility] Diplomate ABMS Pulmonary and Critical Care Medicine Sleep medicine

## 2020-08-15 NOTE — Progress Notes (Deleted)
[facility] [place], [place] [address]  Pulmonary Sleep Medicine   Office Visit Note  Patient Name: CHEY CHO DOB: 02/16/1956 MRN [id number]    Chief Complaint: Obstructive Sleep Apnea visit  Brief History:  Naithen is seen today for *** The patient has a *** history of sleep apnea. Patient *** using PAP nightly.  The patient feels *** after sleeping with PAP.  The patient reports *** from PAP use. Reported sleepiness is  *** and the Epworth Sleepiness Score is *** out of 24. The patient *** take naps. The patient complains of the following: ***  The compliance download shows  compliance with an average use time of *** hours. The AHI is ***  The patient *** of limb movements disrupting sleep.  ROS  General: (-) fever, (-) chills, (-) night sweat Nose and Sinuses: (-) nasal stuffiness or itchiness, (-) postnasal drip, (-) nosebleeds, (-) sinus trouble. Mouth and Throat: (-) sore throat, (-) hoarseness. Neck: (-) swollen glands, (-) enlarged thyroid, (-) neck pain. Respiratory: *** cough, *** shortness of breath, *** wheezing. Neurologic: *** numbness, *** tingling. Psychiatric: *** anxiety, *** depression   Current Medication: Outpatient Encounter Medications as of 08/15/2020  Medication Sig  . acetaminophen (TYLENOL) 500 MG tablet Take 1,000 mg by mouth every 6 (six) hours as needed for moderate pain or headache.  . allopurinol (ZYLOPRIM) 100 MG tablet Take 100 mg by mouth daily.  . Carboxymethylcellulose Sodium (ARTIFICIAL TEARS OP) Place 1 drop into both eyes daily as needed (dry eyes).  . carvedilol (COREG) 12.5 MG tablet Take 25 mg by mouth 2 (two) times daily with a meal.   . colchicine 0.6 MG tablet Take 0.6 mg by mouth 2 (two) times daily as needed (gout flare).   . hydrochlorothiazide (HYDRODIURIL) 25 MG tablet Take 25 mg by mouth daily.  Marland Kitchen HYDROcodone-Acetaminophen 5-300 MG TABS Take 1-2 tablets by mouth every 4 (four) hours as needed.  Marland Kitchen  LORazepam (ATIVAN) 0.5 MG tablet Take 0.5 mg by mouth at bedtime as needed for sleep.  Marland Kitchen losartan (COZAAR) 50 MG tablet Take 100 mg by mouth daily.  . Multiple Vitamin (MULTIVITAMIN WITH MINERALS) TABS tablet Take 1 tablet by mouth daily.  . naproxen sodium (ALEVE) 220 MG tablet Take 220 mg by mouth 2 (two) times daily as needed (pain).  Marland Kitchen ofloxacin (OCUFLOX) 0.3 % ophthalmic solution Place 2 drops into both eyes daily as needed (infection).   Marland Kitchen omeprazole (PRILOSEC) 20 MG capsule Take 20 mg by mouth daily.  . prednisoLONE acetate (PRED FORTE) 1 % ophthalmic suspension Place 1 drop into both eyes daily as needed (allergies).   No facility-administered encounter medications on file as of 08/15/2020.    Surgical History: Past Surgical History:  Procedure Laterality Date  . KNEE SURGERY  1974  . PARATHYROIDECTOMY Left 03/29/2020   Procedure: PARATHYROIDECTOMY;  Surgeon: Beverly Gust, MD;  Location: [facility] ORS;  Service: ENT;  Laterality: Left;    Medical History: Past Medical History:  Diagnosis Date  . GERD (gastroesophageal reflux disease)   . Gout   . Hypertension   . PVC (premature ventricular contraction)   . Sleep apnea     Family History: Non contributory to the present illness  Social History: Social History   Socioeconomic History  . Marital status: Widowed    Spouse name: Not on file  . Number of children: Not on file  . Years of education: Not on file  . Highest education level: Not  on file  Occupational History  . Not on file  Tobacco Use  . Smoking status: Never Smoker  . Smokeless tobacco: Never Used  Substance and Sexual Activity  . Alcohol use: Yes    Comment: occasional  . Drug use: Never  . Sexual activity: Not on file  Other Topics Concern  . Not on file  Social History Narrative  . Not on file   Social Determinants of Health   Financial Resource Strain:   . Difficulty of Paying Living Expenses: Not on file  Food Insecurity:   . Worried  About Charity fundraiser in the Last Year: Not on file  . Ran Out of Food in the Last Year: Not on file  Transportation Needs:   . Lack of Transportation (Medical): Not on file  . Lack of Transportation (Non-Medical): Not on file  Physical Activity:   . Days of Exercise per Week: Not on file  . Minutes of Exercise per Session: Not on file  Stress:   . Feeling of Stress : Not on file  Social Connections:   . Frequency of Communication with Friends and Family: Not on file  . Frequency of Social Gatherings with Friends and Family: Not on file  . Attends Religious Services: Not on file  . Active Member of Clubs or Organizations: Not on file  . Attends Archivist Meetings: Not on file  . Marital Status: Not on file  Intimate Partner Violence:   . Fear of Current or Ex-Partner: Not on file  . Emotionally Abused: Not on file  . Physically Abused: Not on file  . Sexually Abused: Not on file    Vital Signs: Blood pressure (!) 173/105, pulse 66, temperature 98.1 F (36.7 C), height 6' (1.829 m), weight 284 lb (128.8 kg), SpO2 98 %.  Examination: General Appearance: The patient is well-developed, well-nourished, and in no distress. Neck Circumference: *** Skin: Gross inspection of skin unremarkable. Head: normocephalic, no gross deformities. Eyes: no gross deformities noted. ENT: ears appear grossly normal Neurologic: Alert and oriented. No involuntary movements.    EPWORTH SLEEPINESS SCALE:  Scale:  (0)= no chance of dozing; (1)= slight chance of dozing; (2)= moderate chance of dozing; (3)= high chance of dozing  Chance  Situtation    Sitting and reading: ***    Watching TV: ***    Sitting Inactive in public: ***    As a passenger in car: ***      Lying down to rest: ***    Sitting and talking: ***    Sitting quielty after lunch: ***    In a car, stopped in traffic: ***   TOTAL SCORE:   *** out of 24    SLEEP STUDIES:  1. ***   CPAP COMPLIANCE  DATA:  Date Range: ***  Average Daily Use: *** hours  Median Use: ***  Compliance for > 4 Hours: *** days  AHI: *** respiratory events per hour  Days Used: ***  Mask Leak: ***  95th Percentile Pressure: ***         LABS: No results found for this or any previous visit (from the past 2160 hour(s)).  Radiology: No results found.  No results found.  No results found.    Assessment and Plan: There are no problems to display for this patient.     The patient *** tolerate PAP and reports *** benefit from PAP use. The patient was reminded how to *** and advised to ***. The patient  was also counselled on ***. The compliance is ***. The AHI is ***.   1. ***  General Counseling: I have discussed the findings of the evaluation and examination with Lanny Hurst.  I have also discussed any further diagnostic evaluation thatmay be needed or ordered today. Osiah verbalizes understanding of the findings of todays visit. We also reviewed his medications today and discussed drug interactions and side effects including but not limited excessive drowsiness and altered mental states. We also discussed that there is always a risk not just to him but also people around him. he has been encouraged to call the office with any questions or concerns that should arise related to todays visit.  No orders of the defined types were placed in this encounter.       I have personally obtained a history, examined the patient, evaluated laboratory and imaging results, formulated the assessment and plan and placed orders.   Richelle Ito Saunders Glance, PhD, FAASM  Diplomate, American Board of Sleep Medicine    Allyne Gee, MD [facility] Diplomate ABMS Pulmonary and Critical Care Medicine Sleep medicine

## 2020-08-15 NOTE — Patient Instructions (Signed)

## 2021-07-03 IMAGING — NM NM PARATHYROID W/ SPECT / CT
1 series · 12 of 14 positions shown, 15 images · non-contrast
Comparison: None

CLINICAL DATA: Hypercalcemia, hyperparathyroidism

EXAM:
NM PARATHYROID SCINTIGRAPHY AND SPECT IMAGING
TECHNIQUE: Following intravenous administration of radiopharmaceutical, early
and 2-hour delayed planar images were obtained in the anterior
projection. Delayed triplanar SPECT images were also obtained at 2
hours. Concomitant CT images were obtained. Fused data sets are
evaluated in 3 planes.
RADIOPHARMACEUTICALS:  24.77 mCi Fc-LLm Sestamibi IV

[Series 3: 3d parathroid 1.25 b31s · axial · 0.98mm/px · z∈[+1660,+1900]mm · 12 of 356 slices shown, 15 images]
[im 28/356  soft-tissue]
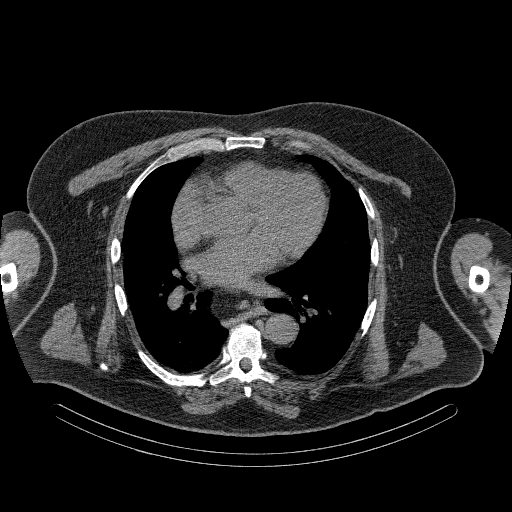
[im 28/356  bone]
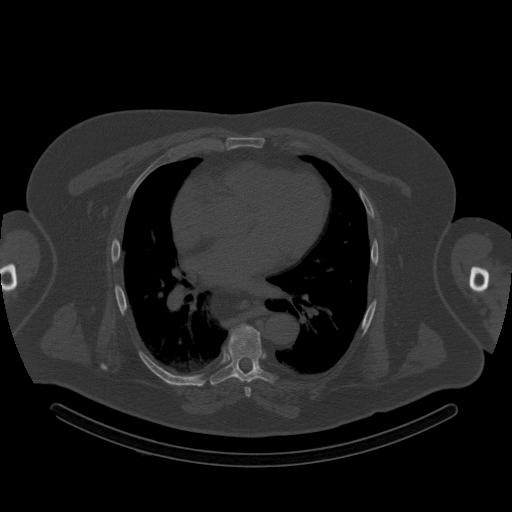
[im 55/356  bone]
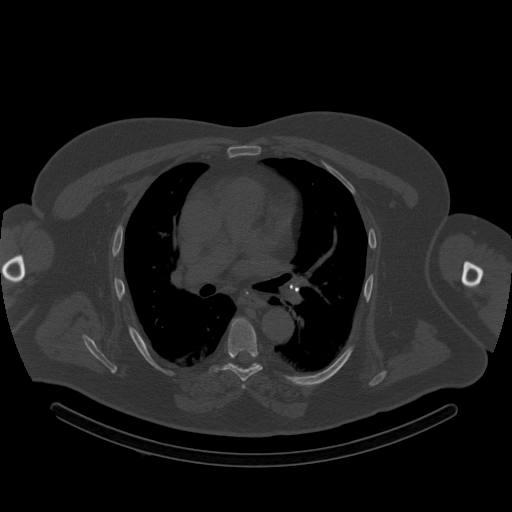
[im 82/356  bone]
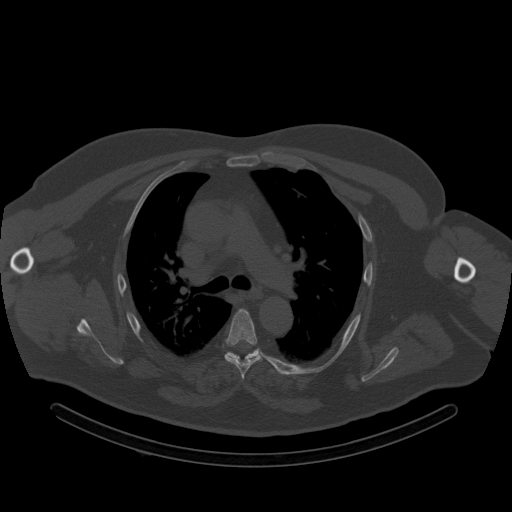
[im 110/356  bone]
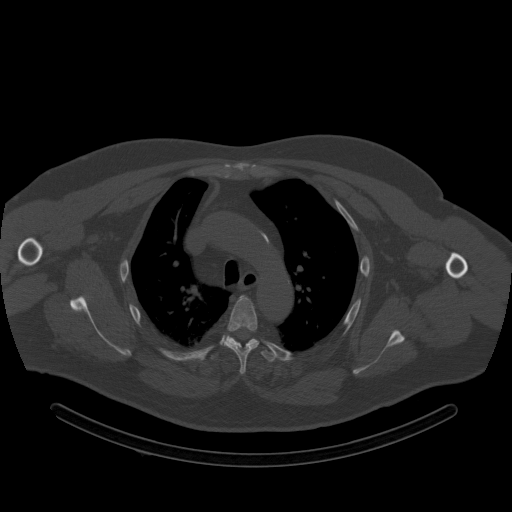
[im 137/356  soft-tissue]
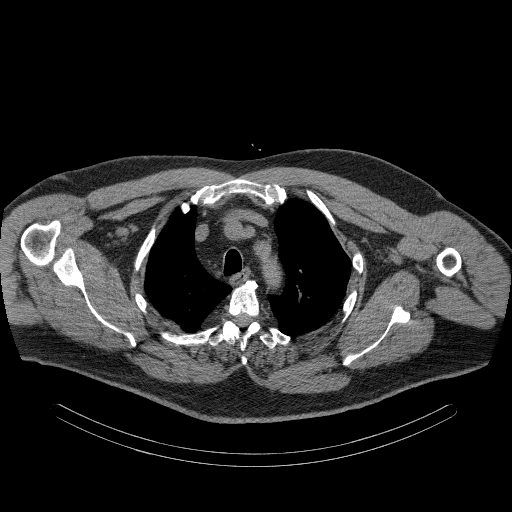
[im 137/356  bone]
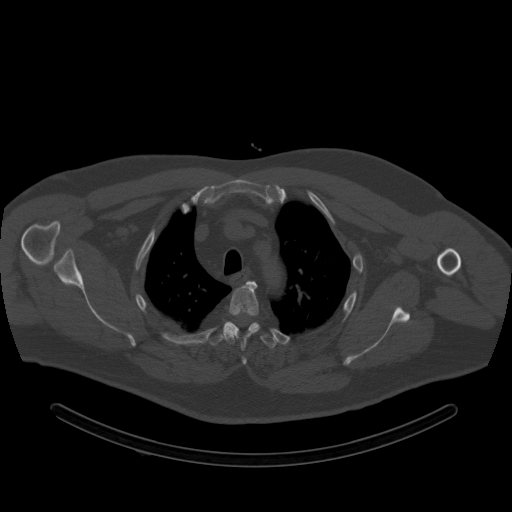
[im 164/356  bone]
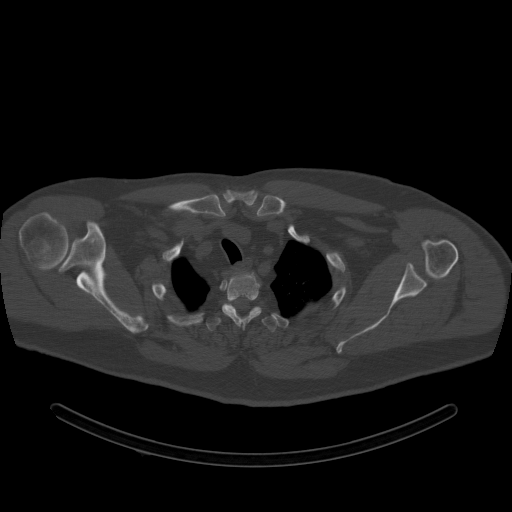
[im 192/356  bone]
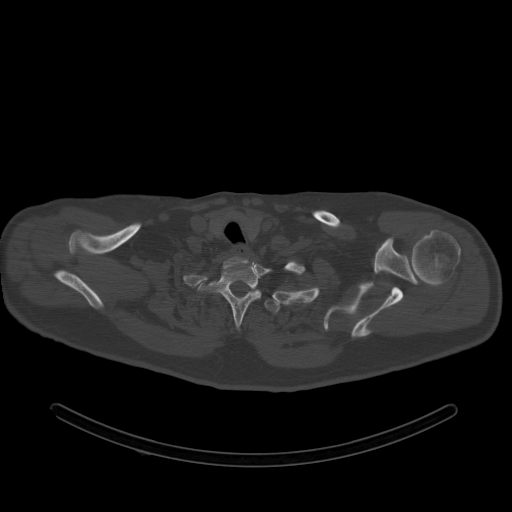
[im 219/356  bone]
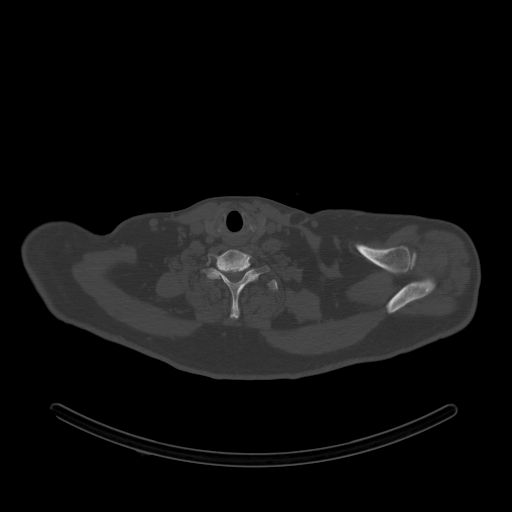
[im 246/356  soft-tissue]
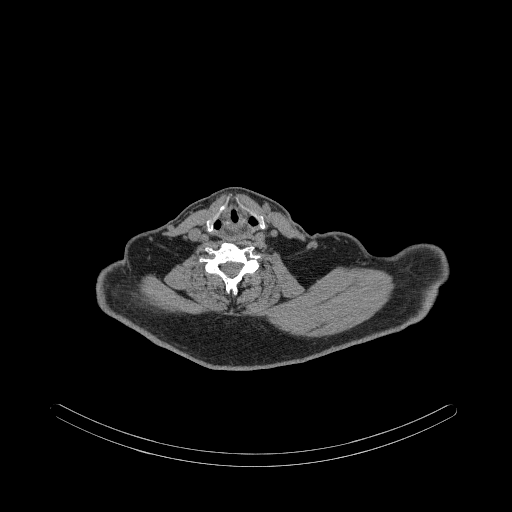
[im 246/356  bone]
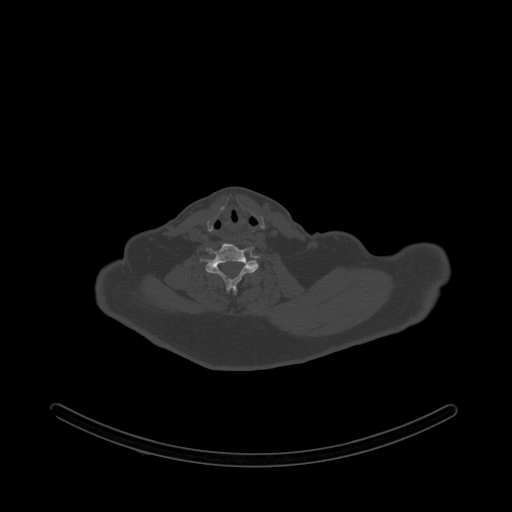
[im 274/356  bone]
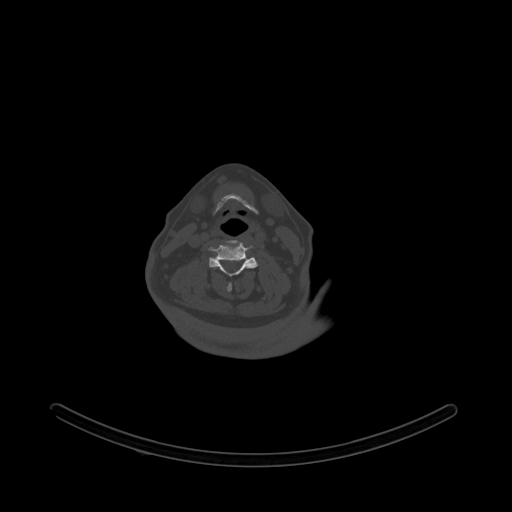
[im 301/356  bone]
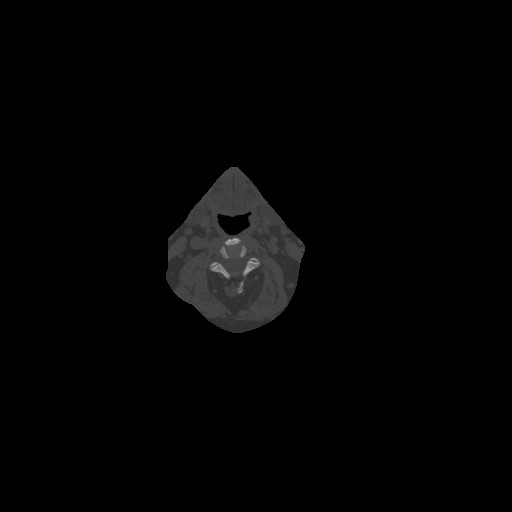
[im 328/356  bone]
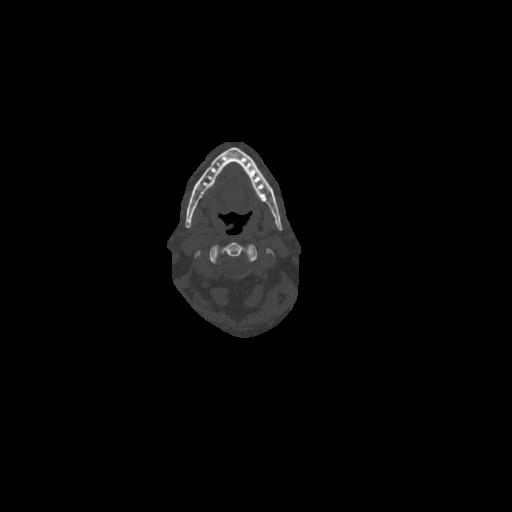

[12 of 14 positions shown; findings below may reference images not displayed]

FINDINGS: Planar imaging: Asymmetric tracer distribution throughout LEFT
thyroid lobe versus RIGHT. Normal washout of tracer from thyroid
tissue with a focal area of abnormal sestamibi retention at the mid
LEFT thyroid lobe.

SPECT imaging: Focal abnormal sestamibi retention identified mid to
upper LEFT thyroid lobe. This corresponds to a small soft tissue
nodule measuring 11 x 4 mm in axial dimensions and extending
approximately 25 mm length. Finding is consistent with a parathyroid
adenoma. No abnormal tracer localization at the remaining
parathyroid glands. No ectopic localization of tracer in the
mediastinum.
IMPRESSION: Abnormal parathyroid scan demonstrating abnormal sestamibi retention
at a 11 x 4 x ~25 mm nodule posterior to the upper/mid LEFT
thyroid lobe consistent with a LEFT upper parathyroid adenoma.

## 2021-08-25 NOTE — Progress Notes (Signed)
[facility] [place], [place] [address]  Pulmonary Sleep Medicine   Office Visit Note  Patient Name: Douglas Daugherty DOB: 27-Jun-1956 MRN [id number]    Chief Complaint: Obstructive Sleep Apnea visit  Brief History:  Sherrick is seen today for one year follow uo The patient has a 13 years history of sleep apnea. Patient is using PAP nightly.  The patient feels better after sleeping with PAP.  The patient reports benefiting from from PAP use. Reported sleepiness is  eliminated and the Epworth Sleepiness Score is 1 out of 24. The patient naps take naps. The patient has no complaints or concerns at this time.  The compliance download shows  compliance with an average use time of 8:37 hours @ 100%. The AHI is 1.3  The patient does not complain of limb movements disrupting sleep.  ROS  General: (-) fever, (-) chills, (-) night sweat Nose and Sinuses: (-) nasal stuffiness or itchiness, (-) postnasal drip, (-) nosebleeds, (-) sinus trouble. Mouth and Throat: (-) sore throat, (-) hoarseness. Neck: (-) swollen glands, (-) enlarged thyroid, (-) neck pain. Respiratory: - cough, - shortness of breath, - wheezing. Neurologic: - numbness, - tingling. Psychiatric: - anxiety, - depression   Current Medication: Outpatient Encounter Medications as of 08/28/2021  Medication Sig   acetaminophen (TYLENOL) 500 MG tablet Take 1,000 mg by mouth every 6 (six) hours as needed for moderate pain or headache.   allopurinol (ZYLOPRIM) 100 MG tablet Take 100 mg by mouth daily.   Carboxymethylcellulose Sodium (ARTIFICIAL TEARS OP) Place 1 drop into both eyes daily as needed (dry eyes).   carvedilol (COREG) 12.5 MG tablet Take 25 mg by mouth 2 (two) times daily with a meal.    carvedilol (COREG) 25 MG tablet Take 25 mg by mouth 2 (two) times daily.   colchicine 0.6 MG tablet Take 0.6 mg by mouth 2 (two) times daily as needed (gout flare).    hydrochlorothiazide (HYDRODIURIL) 25 MG tablet  Take 25 mg by mouth daily.   HYDROcodone-Acetaminophen 5-300 MG TABS Take 1-2 tablets by mouth every 4 (four) hours as needed.   LORazepam (ATIVAN) 0.5 MG tablet Take 0.5 mg by mouth at bedtime as needed for sleep.   losartan (COZAAR) 50 MG tablet Take 100 mg by mouth daily.   Multiple Vitamin (MULTIVITAMIN WITH MINERALS) TABS tablet Take 1 tablet by mouth daily.   naproxen sodium (ALEVE) 220 MG tablet Take 220 mg by mouth 2 (two) times daily as needed (pain).   ofloxacin (OCUFLOX) 0.3 % ophthalmic solution Place 2 drops into both eyes daily as needed (infection).    omeprazole (PRILOSEC) 20 MG capsule Take 20 mg by mouth daily.   prednisoLONE acetate (PRED FORTE) 1 % ophthalmic suspension Place 1 drop into both eyes daily as needed (allergies).   No facility-administered encounter medications on file as of 08/28/2021.    Surgical History: Past Surgical History:  Procedure Laterality Date   KNEE SURGERY  1974   PARATHYROIDECTOMY Left 03/29/2020   Procedure: PARATHYROIDECTOMY;  Surgeon: Beverly Gust, MD;  Location: [facility] ORS;  Service: ENT;  Laterality: Left;    Medical History: Past Medical History:  Diagnosis Date   GERD (gastroesophageal reflux disease)    Gout    Hypertension    PVC (premature ventricular contraction)    Sleep apnea     Family History: Non contributory to the present illness  Social History: Social History   Socioeconomic History   Marital status: Widowed  Spouse name: Not on file   Number of children: Not on file   Years of education: Not on file   Highest education level: Not on file  Occupational History   Not on file  Tobacco Use   Smoking status: Never   Smokeless tobacco: Never  Substance and Sexual Activity   Alcohol use: Yes    Comment: occasional   Drug use: Never   Sexual activity: Not on file  Other Topics Concern   Not on file  Social History Narrative   Not on file   Social Determinants of Health   Financial Resource  Strain: Not on file  Food Insecurity: Not on file  Transportation Needs: Not on file  Physical Activity: Not on file  Stress: Not on file  Social Connections: Not on file  Intimate Partner Violence: Not on file    Vital Signs: Blood pressure (!) 197/63, pulse 67, resp. rate 18, height 6' (1.829 m), weight 270 lb 3.2 oz (122.6 kg), SpO2 99 %.  Examination: General Appearance: The patient is well-developed, well-nourished, and in no distress. Neck Circumference: 45 cm Skin: Gross inspection of skin unremarkable. Head: normocephalic, no gross deformities. Eyes: no gross deformities noted. ENT: ears appear grossly normal Neurologic: Alert and oriented. No involuntary movements.    EPWORTH SLEEPINESS SCALE:  Scale:  (0)= no chance of dozing; (1)= slight chance of dozing; (2)= moderate chance of dozing; (3)= high chance of dozing  Chance  Situtation    Sitting and reading: 0    Watching TV: 0    Sitting Inactive in public: 0    As a passenger in car: 1      Lying down to rest: 0    Sitting and talking: 0    Sitting quielty after lunch: 0    In a car, stopped in traffic: 0   TOTAL SCORE:   1 out of 24    SLEEP STUDIES:  PSG 10/07/2008  - AHI 76,  low SpO2 80%   CPAP COMPLIANCE DATA:  Date Range: 08/24/20 - 08/23/21  Average Daily Use: 8:37   Median Use: 8:30 hours  Compliance for > 4 Hours: 100%   AHI: 1.3 respiratory events per hour  Days Used: 365/365  Mask Leak: 4.5 lpm  95th Percentile Pressure: 16 cmH2O         LABS: No results found for this or any previous visit (from the past 2160 hour(s)).  Radiology: No results found.  No results found.  No results found.    Assessment and Plan: Patient Active Problem List   Diagnosis Date Noted   CPAP use counseling 08/28/2021   Obesity (BMI 30-39.9) 08/28/2021   OSA on CPAP 08/15/2020   Hypertension 08/15/2020   BMI 38.0-38.9,adult 08/15/2020   1. OSA on CPAP The patient does  tolerate PAP and reports definite benefit from PAP use. The patient was reminded how to clean equipment and advised to replace supplies routinely. The patient was also counselled on weight loss. The compliance is excellent. The AHI is 1.3.   OSA- continue excellent compliance. F/u one year   2. CPAP use counseling CPAP Counseling: had a lengthy discussion with the patient regarding the importance of PAP therapy in management of the sleep apnea. Patient appears to understand the risk factor reduction and also understands the risks associated with untreated sleep apnea. Patient will try to make a good faith effort to remain compliant with therapy. Also instructed the patient on proper cleaning of the device  including the water must be changed daily if possible and use of distilled water is preferred. Patient understands that the machine should be regularly cleaned with appropriate recommended cleaning solutions that do not damage the PAP machine for example given white vinegar and water rinses. Other methods such as ozone treatment may not be as good as these simple methods to achieve cleaning.   3. Obesity (BMI 30-39.9) Obesity Counseling: Had a lengthy discussion regarding patients BMI and weight issues. Patient was instructed on portion control as well as increased activity. Also discussed caloric restrictions with trying to maintain intake less than 2000 Kcal. Discussions were made in accordance with the 5As of weight management. Simple actions such as not eating late and if able to, taking a walk is suggested.      General Counseling: I have discussed the findings of the evaluation and examination with Lanny Hurst.  I have also discussed any further diagnostic evaluation thatmay be needed or ordered today. Diante verbalizes understanding of the findings of todays visit. We also reviewed his medications today and discussed drug interactions and side effects including but not limited excessive drowsiness and  altered mental states. We also discussed that there is always a risk not just to him but also people around him. he has been encouraged to call the office with any questions or concerns that should arise related to todays visit.  No orders of the defined types were placed in this encounter.       I have personally obtained a history, examined the patient, evaluated laboratory and imaging results, formulated the assessment and plan and placed orders.   This patient was seen today by Tressie Ellis, PA-C in collaboration with Dr. Devona Konig.     Allyne Gee, MD [facility] Diplomate ABMS Pulmonary and Critical Care Medicine Sleep medicine

## 2021-08-28 ENCOUNTER — Other Ambulatory Visit: Payer: Self-pay

## 2021-08-28 ENCOUNTER — Ambulatory Visit (INDEPENDENT_AMBULATORY_CARE_PROVIDER_SITE_OTHER): Payer: Medicare PPO | Admitting: Internal Medicine

## 2021-08-28 VITALS — BP 197/63 | HR 67 | Resp 18 | Ht 72.0 in | Wt 270.2 lb

## 2021-08-28 DIAGNOSIS — E669 Obesity, unspecified: Secondary | ICD-10-CM | POA: Insufficient documentation

## 2021-08-28 DIAGNOSIS — Z7189 Other specified counseling: Secondary | ICD-10-CM | POA: Insufficient documentation

## 2021-08-28 DIAGNOSIS — Z9989 Dependence on other enabling machines and devices: Secondary | ICD-10-CM

## 2021-08-28 DIAGNOSIS — G4733 Obstructive sleep apnea (adult) (pediatric): Secondary | ICD-10-CM | POA: Diagnosis not present

## 2021-08-28 NOTE — Patient Instructions (Signed)

## 2022-08-27 ENCOUNTER — Ambulatory Visit (INDEPENDENT_AMBULATORY_CARE_PROVIDER_SITE_OTHER): Payer: Medicare PPO | Admitting: Internal Medicine

## 2022-08-27 VITALS — BP 176/109 | HR 71 | Resp 16 | Ht 72.0 in | Wt 292.0 lb

## 2022-08-27 DIAGNOSIS — Z7189 Other specified counseling: Secondary | ICD-10-CM | POA: Diagnosis not present

## 2022-08-27 DIAGNOSIS — E669 Obesity, unspecified: Secondary | ICD-10-CM | POA: Diagnosis not present

## 2022-08-27 DIAGNOSIS — Z9989 Dependence on other enabling machines and devices: Secondary | ICD-10-CM | POA: Diagnosis not present

## 2022-08-27 DIAGNOSIS — G4733 Obstructive sleep apnea (adult) (pediatric): Secondary | ICD-10-CM | POA: Diagnosis not present

## 2022-08-27 NOTE — Patient Instructions (Signed)

## 2022-08-27 NOTE — Progress Notes (Unsigned)
[facility] [place], [place] [address]  Pulmonary Sleep Medicine   Office Visit Note  Patient Name: Douglas Daugherty DOB: Nov 22, 1956 MRN [id number]    Chief Complaint: Obstructive Sleep Apnea visit  Brief History:  Kahari is seen today for an annual follow up on APAP at 14-20 cmh20.  The patient has a 14 year history of sleep apnea. Patient is using PAP nightly.  The patient feels rested after sleeping with PAP.  The patient reports benefits from PAP use. Reported sleepiness is improved and the Epworth Sleepiness Score is 1 out of 24. The patient does not take naps. The patient complains of the following: No complaints.  The compliance download shows 100% compliance with an average use time of 8 hours 15 minutes. The AHI is 1.4.  The patient does not complain of limb movements disrupting sleep.  ROS  General: (-) fever, (-) chills, (-) night sweat Nose and Sinuses: (-) nasal stuffiness or itchiness, (-) postnasal drip, (-) nosebleeds, (-) sinus trouble. Mouth and Throat: (-) sore throat, (-) hoarseness. Neck: (-) swollen glands, (-) enlarged thyroid, (-) neck pain. Respiratory: - cough, - shortness of breath, - wheezing. Neurologic: - numbness, - tingling. Psychiatric: - anxiety, - depression   Current Medication: Outpatient Encounter Medications as of 08/27/2022  Medication Sig   acetaminophen (TYLENOL) 500 MG tablet Take 1,000 mg by mouth every 6 (six) hours as needed for moderate pain or headache.   allopurinol (ZYLOPRIM) 100 MG tablet Take 100 mg by mouth daily.   Carboxymethylcellulose Sodium (ARTIFICIAL TEARS OP) Place 1 drop into both eyes daily as needed (dry eyes).   carvedilol (COREG) 12.5 MG tablet Take 25 mg by mouth 2 (two) times daily with a meal.    carvedilol (COREG) 25 MG tablet Take 25 mg by mouth 2 (two) times daily.   colchicine 0.6 MG tablet Take 0.6 mg by mouth 2 (two) times daily as needed (gout flare).    hydrochlorothiazide  (HYDRODIURIL) 25 MG tablet Take 25 mg by mouth daily.   HYDROcodone-Acetaminophen 5-300 MG TABS Take 1-2 tablets by mouth every 4 (four) hours as needed.   LORazepam (ATIVAN) 0.5 MG tablet Take 0.5 mg by mouth at bedtime as needed for sleep.   losartan (COZAAR) 50 MG tablet Take 100 mg by mouth daily.   Multiple Vitamin (MULTIVITAMIN WITH MINERALS) TABS tablet Take 1 tablet by mouth daily.   naproxen sodium (ALEVE) 220 MG tablet Take 220 mg by mouth 2 (two) times daily as needed (pain).   ofloxacin (OCUFLOX) 0.3 % ophthalmic solution Place 2 drops into both eyes daily as needed (infection).    omeprazole (PRILOSEC) 20 MG capsule Take 20 mg by mouth daily.   prednisoLONE acetate (PRED FORTE) 1 % ophthalmic suspension Place 1 drop into both eyes daily as needed (allergies).   No facility-administered encounter medications on file as of 08/27/2022.    Surgical History: Past Surgical History:  Procedure Laterality Date   KNEE SURGERY  1974   PARATHYROIDECTOMY Left 03/29/2020   Procedure: PARATHYROIDECTOMY;  Surgeon: Beverly Gust, MD;  Location: [facility] ORS;  Service: ENT;  Laterality: Left;    Medical History: Past Medical History:  Diagnosis Date   GERD (gastroesophageal reflux disease)    Gout    Hypertension    PVC (premature ventricular contraction)    Sleep apnea     Family History: Non contributory to the present illness  Social History: Social History   Socioeconomic History   Marital status:  Widowed    Spouse name: Not on file   Number of children: Not on file   Years of education: Not on file   Highest education level: Not on file  Occupational History   Not on file  Tobacco Use   Smoking status: Never   Smokeless tobacco: Never  Substance and Sexual Activity   Alcohol use: Yes    Comment: occasional   Drug use: Never   Sexual activity: Not on file  Other Topics Concern   Not on file  Social History Narrative   Not on file   Social Determinants of Health    Financial Resource Strain: Not on file  Food Insecurity: Not on file  Transportation Needs: Not on file  Physical Activity: Not on file  Stress: Not on file  Social Connections: Not on file  Intimate Partner Violence: Not on file    Vital Signs: There were no vitals taken for this visit. There is no height or weight on file to calculate BMI.    Examination: General Appearance: The patient is well-developed, well-nourished, and in no distress. Neck Circumference: 47 cm Skin: Gross inspection of skin unremarkable. Head: normocephalic, no gross deformities. Eyes: no gross deformities noted. ENT: ears appear grossly normal Neurologic: Alert and oriented. No involuntary movements.  STOP BANG RISK ASSESSMENT S (snore) Have you been told that you snore?     NO   T (tired) Are you often tired, fatigued, or sleepy during the day?   NO  O (obstruction) Do you stop breathing, choke, or gasp during sleep? NO   P (pressure) Do you have or are you being treated for high blood pressure? YES   B (BMI) Is your body index greater than 35 kg/m? YES   A (age) Are you 68 years old or older? YES   N (neck) Do you have a neck circumference greater than 16 inches?   YES  G (gender) Are you a male? YES   TOTAL STOP/BANG "YES" ANSWERS 5       A STOP-Bang score of 2 or less is considered low risk, and a score of 5 or more is high risk for having either moderate or severe OSA. For people who score 3 or 4, doctors may need to perform further assessment to determine how likely they are to have OSA.         EPWORTH SLEEPINESS SCALE:  Scale:  (0)= no chance of dozing; (1)= slight chance of dozing; (2)= moderate chance of dozing; (3)= high chance of dozing  Chance  Situtation    Sitting and reading: 0    Watching TV: 0    Sitting Inactive in public: 0    As a passenger in car: 1      Lying down to rest: 0    Sitting and talking: 0    Sitting quielty after lunch: 0    In a car,  stopped in traffic: 0   TOTAL SCORE:   1 out of 24    SLEEP STUDIES:  PSG (10/07/08)  AHI 75.6, SPO2  31% Titration (10/16/08)  CPAP at 16 cmh20   CPAP COMPLIANCE DATA:  Date Range: 05/25/22 - 08/22/22  Average Daily Use: 8 hours 15 minutes  Median Use: 8 hours 17 minutes  Compliance for > 4 Hours: 90 days  AHI: 1.4 respiratory events per hour  Days Used: 90/90  Mask Leak: 8.6  95th Percentile Pressure: 18 cmh20         LABS:  No results found for this or any previous visit (from the past 2160 hour(s)).  Radiology: No results found.  No results found.  No results found.    Assessment and Plan: Patient Active Problem List   Diagnosis Date Noted   CPAP use counseling 08/28/2021   Obesity (BMI 30-39.9) 08/28/2021   OSA on CPAP 08/15/2020   Hypertension 08/15/2020   BMI 38.0-38.9,adult 08/15/2020    1. OSA on CPAP The patient does tolerate PAP and reports  benefit from PAP use. The patient was reminded how to clean equipment and advised to replace supplies routinely. The patient was also counselled on weight loss. The compliance is excellent.. The AHI is 1.6.   OSA on cpap- CPAP continues to be medically necessary to treat this patient's OSA.  Continue with excellent compliance. F/u  one year.    2. CPAP use counseling CPAP Counseling: had a lengthy discussion with the patient regarding the importance of PAP therapy in management of the sleep apnea. Patient appears to understand the risk factor reduction and also understands the risks associated with untreated sleep apnea. Patient will try to make a good faith effort to remain compliant with therapy. Also instructed the patient on proper cleaning of the device including the water must be changed daily if possible and use of distilled water is preferred. Patient understands that the machine should be regularly cleaned with appropriate recommended cleaning solutions that do not damage the PAP machine for  example given white vinegar and water rinses. Other methods such as ozone treatment may not be as good as these simple methods to achieve cleaning.   3. Obesity (BMI 30-39.9) Obesity Counseling: Had a lengthy discussion regarding patients BMI and weight issues. Patient was instructed on portion control as well as increased activity. Also discussed caloric restrictions with trying to maintain intake less than 2000 Kcal. Discussions were made in accordance with the 5As of weight management. Simple actions such as not eating late and if able to, taking a walk is suggested.     General Counseling: I have discussed the findings of the evaluation and examination with Lanny Hurst.  I have also discussed any further diagnostic evaluation thatmay be needed or ordered today. Gianmarco verbalizes understanding of the findings of todays visit. We also reviewed his medications today and discussed drug interactions and side effects including but not limited excessive drowsiness and altered mental states. We also discussed that there is always a risk not just to him but also people around him. he has been encouraged to call the office with any questions or concerns that should arise related to todays visit.  No orders of the defined types were placed in this encounter.       I have personally obtained a history, examined the patient, evaluated laboratory and imaging results, formulated the assessment and plan and placed orders. This patient was seen today by Tressie Ellis, PA-C in collaboration with Dr. Devona Konig.   Allyne Gee, MD [facility] Diplomate ABMS Pulmonary Critical Care Medicine and Sleep Medicine

## 2023-03-04 ENCOUNTER — Other Ambulatory Visit: Payer: Self-pay | Admitting: Unknown Physician Specialty

## 2023-03-04 DIAGNOSIS — D37030 Neoplasm of uncertain behavior of the parotid salivary glands: Secondary | ICD-10-CM

## 2023-03-07 ENCOUNTER — Ambulatory Visit
Admission: RE | Admit: 2023-03-07 | Discharge: 2023-03-07 | Disposition: A | Discharge status: Home / Self Care | Payer: Medicare PPO | Source: Ambulatory Visit | Attending: Unknown Physician Specialty | Admitting: Unknown Physician Specialty

## 2023-03-07 DIAGNOSIS — D37030 Neoplasm of uncertain behavior of the parotid salivary glands: Secondary | ICD-10-CM

## 2023-03-07 MED ORDER — IOPAMIDOL (ISOVUE-300) INJECTION 61%
75.0000 mL | Freq: Once | INTRAVENOUS | Status: AC | PRN
  Administered 2023-03-07: 75 mL via INTRAVENOUS

## 2023-03-12 NOTE — Progress Notes (Signed)
PROCEDURE / BIOPSY REVIEW Date: 03/12/23  Requested Biopsy site: Left parotid mass Reason for request: Left parotid mass Imaging review: Best seen on CT and I reviewed all pertinent diagnostic studies, including; CT  Decision: Approved Imaging modality to perform: Ultrasound Schedule with: No sedation / Local anesthetic Schedule for: Any VIR  Additional comments: @Schedulers . None  Please contact me with questions, concerns, or if issue pertaining to this request arise.  Azzie Roup, MD [facility]       Previous Messages    ----- Message ----- From: Norton Blizzard Sent: 03/12/2023  10:56 AM EDT To: Ir Procedure Requests Subject: US Biopsy                                      IR Approval Request:   Procedure:    US guided Core LT Parotid Mass Biopsy  Reason:        LT Parotid Mass  History:         CT soft tissue neck w/contrast    03/07/2023  Provider:       Dr Beverly Gust, MD  Provider Contact #   [facility]   [phone]

## 2023-03-13 ENCOUNTER — Other Ambulatory Visit: Payer: Self-pay | Admitting: Unknown Physician Specialty

## 2023-03-13 DIAGNOSIS — K118 Other diseases of salivary glands: Secondary | ICD-10-CM

## 2023-03-18 NOTE — Progress Notes (Signed)
Patient for US guided LT Parotid Mass Biopsy on Tues 03/19/2023, I called and spoke with the patient on the phone and gave pre-procedure instructions. Pt was made aware to be here at 12:30p and check in at the [facility] registration desk. Pt stated understanding.  Called 03/18/2023

## 2023-03-19 ENCOUNTER — Ambulatory Visit
Admission: RE | Admit: 2023-03-19 | Discharge: 2023-03-19 | Disposition: A | Discharge status: Home / Self Care | Payer: Medicare PPO | Source: Ambulatory Visit | Attending: Unknown Physician Specialty | Admitting: Unknown Physician Specialty

## 2023-03-19 DIAGNOSIS — C7989 Secondary malignant neoplasm of other specified sites: Secondary | ICD-10-CM | POA: Insufficient documentation

## 2023-03-19 DIAGNOSIS — K118 Other diseases of salivary glands: Secondary | ICD-10-CM | POA: Diagnosis present

## 2023-03-19 MED ORDER — LIDOCAINE HCL (PF) 1 % IJ SOLN
5.0000 mL | Freq: Once | INTRAMUSCULAR | Status: AC
  Administered 2023-03-19: 5 mL via INTRADERMAL

## 2023-03-19 NOTE — Discharge Instructions (Signed)
Discharge instructions reviewed with patient.

## 2023-03-19 NOTE — Procedures (Signed)
Interventional Radiology Procedure Note  Procedure: US guided biopsy of left parotid mass Complications: None EBL: None Recommendations:   - Routine wound care - Follow up pathology    Signed,  Corrie Mckusick, DO

## 2023-03-22 LAB — SURGICAL PATHOLOGY

## 2023-03-26 ENCOUNTER — Inpatient Hospital Stay: Payer: Medicare PPO | Attending: Oncology | Admitting: Oncology

## 2023-03-26 ENCOUNTER — Inpatient Hospital Stay: Payer: Medicare PPO

## 2023-03-26 ENCOUNTER — Encounter: Payer: Self-pay | Admitting: Oncology

## 2023-03-26 VITALS — BP 177/98 | HR 73 | Temp 97.9°F | Resp 18 | Ht 72.0 in | Wt 296.0 lb

## 2023-03-26 DIAGNOSIS — C7989 Secondary malignant neoplasm of other specified sites: Secondary | ICD-10-CM

## 2023-03-26 DIAGNOSIS — K219 Gastro-esophageal reflux disease without esophagitis: Secondary | ICD-10-CM | POA: Diagnosis not present

## 2023-03-26 DIAGNOSIS — C07 Malignant neoplasm of parotid gland: Secondary | ICD-10-CM | POA: Insufficient documentation

## 2023-03-26 DIAGNOSIS — M109 Gout, unspecified: Secondary | ICD-10-CM | POA: Insufficient documentation

## 2023-03-26 DIAGNOSIS — Z808 Family history of malignant neoplasm of other organs or systems: Secondary | ICD-10-CM | POA: Diagnosis not present

## 2023-03-26 DIAGNOSIS — I10 Essential (primary) hypertension: Secondary | ICD-10-CM | POA: Insufficient documentation

## 2023-03-26 DIAGNOSIS — Z79899 Other long term (current) drug therapy: Secondary | ICD-10-CM | POA: Insufficient documentation

## 2023-03-26 DIAGNOSIS — Z809 Family history of malignant neoplasm, unspecified: Secondary | ICD-10-CM | POA: Insufficient documentation

## 2023-03-26 DIAGNOSIS — C4492 Squamous cell carcinoma of skin, unspecified: Secondary | ICD-10-CM | POA: Diagnosis not present

## 2023-03-26 DIAGNOSIS — G4730 Sleep apnea, unspecified: Secondary | ICD-10-CM | POA: Diagnosis not present

## 2023-03-26 DIAGNOSIS — K118 Other diseases of salivary glands: Secondary | ICD-10-CM

## 2023-03-26 NOTE — Progress Notes (Signed)
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  He noticed a recent swelling in his left neck and subsequent biopsy confirmed squamous cell carcinoma.  He otherwise feels well.  He denies any dysphagia.  He has no neurologic complaints.  He denies any recent fevers or illnesses.  He has a good appetite and denies weight loss.  He has no chest pain, shortness of breath, cough, or hemoptysis.  He denies any nausea, vomiting, constipation, or diarrhea.  He has no urinary complaints.  Patient offers no further specific complaints today.  REVIEW OF SYSTEMS:   Review of Systems  Constitutional: Negative.  Negative for fever, malaise/fatigue and weight loss.  Respiratory: Negative.  Negative for cough, hemoptysis and shortness of breath.   Cardiovascular: Negative.  Negative for chest pain and leg swelling.  Gastrointestinal: Negative.  Negative for abdominal pain.  Genitourinary: Negative.  Negative for dysuria.  Musculoskeletal: Negative.  Negative for back pain.  Skin: Negative.  Negative for rash.  Neurological: Negative.  Negative for dizziness, focal weakness, weakness and headaches.  Psychiatric/Behavioral: Negative.  The patient is not nervous/anxious.     As per HPI. Otherwise, a complete review of systems is negative.  PAST MEDICAL HISTORY: Past Medical History:  Diagnosis Date   GERD (gastroesophageal reflux disease)    Gout    Hypertension    PVC (premature ventricular contraction)    Sleep apnea    Squamous cell carcinoma of face     PAST SURGICAL  HISTORY: Past Surgical History:  Procedure Laterality Date   KNEE SURGERY  1974   PARATHYROIDECTOMY Left 03/29/2020   Procedure: PARATHYROIDECTOMY;  Surgeon: Linus Salmons, MD;  Location: [facility] ORS;  Service: ENT;  Laterality: Left;    FAMILY HISTORY: Family History  Problem Relation Age of Onset   Hypertension Mother    Cancer Mother        skin cancer   Hypertension Father    Cancer Father        skin   Cancer Sister        skin cancer    ADVANCED DIRECTIVES (Y/N):  N  HEALTH MAINTENANCE: Social History   Tobacco Use   Smoking status: Never   Smokeless tobacco: Never  Substance Use Topics   Alcohol use: Yes    Comment: occasional   Drug use: Never     Colonoscopy:  PAP:  Bone density:  Lipid panel:  No Known Allergies  Current Outpatient Medications  Medication Sig Dispense Refill   acetaminophen (TYLENOL) 500 MG tablet Take 1,000 mg by mouth every 6 (six) hours as needed for moderate pain or headache.     allopurinol (ZYLOPRIM) 100 MG tablet Take 100 mg by mouth daily.     Carboxymethylcellulose Sodium (ARTIFICIAL TEARS OP) Place 1 drop into both eyes daily as needed (dry eyes).     carvedilol (COREG) 25 MG tablet Take 25 mg by mouth 2 (two) times daily.     chlorthalidone (HYGROTON) 25 MG tablet Take 25 mg by mouth every morning.     colchicine 0.6 MG tablet Take 0.6 mg by mouth 2 (  two) times daily as needed (gout flare).      LORazepam (ATIVAN) 0.5 MG tablet Take 0.5 mg by mouth at bedtime as needed for sleep.     losartan (COZAAR) 100 MG tablet Take 100 mg by mouth daily.     Multiple Vitamin (MULTIVITAMIN WITH MINERALS) TABS tablet Take 1 tablet by mouth daily.     naproxen sodium (ALEVE) 220 MG tablet Take 220 mg by mouth 2 (two) times daily as needed (pain).     niacinamide 100 MG tablet Take 100 mg by mouth 2 (two) times daily with a meal.     omeprazole (PRILOSEC) 20 MG capsule Take 20 mg by mouth daily.     potassium chloride SA (KLOR-CON M) 20 MEQ  tablet Take 20 mEq by mouth daily.     ofloxacin (OCUFLOX) 0.3 % ophthalmic solution Place 2 drops into both eyes daily as needed (infection).  (Patient not taking: Reported on 03/26/2023)     prednisoLONE acetate (PRED FORTE) 1 % ophthalmic suspension Place 1 drop into both eyes daily as needed (allergies). (Patient not taking: Reported on 03/26/2023)     No current facility-administered medications for this visit.    OBJECTIVE: Vitals:   03/26/23 1103  BP: (!) 177/98  Pulse: 73  Resp: 18  Temp: 97.9 F (36.6 C)  SpO2: 99%     Body mass index is 40.14 kg/m.    ECOG FS:0 - Asymptomatic  General: Well-developed, well-nourished, no acute distress. Eyes: Pink conjunctiva, anicteric sclera. HEENT: Normocephalic, moist mucous membranes.  Easily palpable 1 to 2 cm left neck mass. Lungs: No audible wheezing or coughing. Heart: Regular rate and rhythm. Abdomen: Soft, nontender, no obvious distention. Musculoskeletal: No edema, cyanosis, or clubbing. Neuro: Alert, answering all questions appropriately. Cranial nerves grossly intact. Skin: No rashes or petechiae noted. Psych: Normal affect.  LAB RESULTS:  Lab Results  Component Value Date   NA 139 03/29/2020   K 3.3 (L) 03/29/2020   CL 102 03/29/2020   CO2 26 03/29/2020   GLUCOSE 142 (H) 03/29/2020   BUN 16 03/29/2020   CREATININE 1.13 03/29/2020   CALCIUM 10.3 03/29/2020   GFRNONAA >60 03/29/2020   GFRAA >60 03/29/2020    Lab Results  Component Value Date   WBC 8.6 03/29/2020   HGB 16.7 03/29/2020   HCT 46.1 03/29/2020   MCV 90.9 03/29/2020   PLT 291 03/29/2020     STUDIES: US CORE BIOPSY (SALIVARY GLAND/PAROTID GLAND)  Result Date: 03/19/2023 INDICATION: 67 year old male referred for biopsy of left parotid gland mass EXAM: SALIVARY GLAND CORE BIOPSY MEDICATIONS: None. ANESTHESIA/SEDATION: None FLUOROSCOPY TIME:  None COMPLICATIONS: None PROCEDURE: Informed written consent was obtained from the patient after a thorough  discussion of the procedural risks, benefits and alternatives. All questions were addressed. Maximal Sterile Barrier Technique was utilized including caps, mask, sterile gowns, sterile gloves, sterile drape, hand hygiene and skin antiseptic. A timeout was performed prior to the initiation of the procedure. Ultrasound survey was performed with images stored and sent to PACs. The left neck was prepped with chlorhexidine in a sterile fashion, and a sterile drape was applied covering the operative field. A sterile gown and sterile gloves were used for the procedure. Local anesthesia was provided with 1% Lidocaine. Ultrasound guidance was used to infiltrate the region with 1% lidocaine for local anesthesia. Three separate 18 gauge core needle biopsy were then acquired of the left parotid nodule using ultrasound guidance. Images were stored. Final image was stored after biopsy.  Patient tolerated the procedure well and remained hemodynamically stable throughout. No complications were encountered and no significant blood loss was encounter IMPRESSION: Status post ultrasound-guided biopsy of left parotid mass. Signed, Yvone Neu. Miachel Roux, RPVI [facility] Radiology Electronically Signed   By: Gilmer Mor D.O.   On: 03/19/2023 14:40   CT SOFT TISSUE NECK W CONTRAST  Result Date: 03/11/2023 CLINICAL DATA:  Left parotid mass noted over the last 3 months. History of parathyroid surgery. History of skin cancer of the face. EXAM: CT NECK WITH CONTRAST TECHNIQUE: Multidetector CT imaging of the neck was performed using the standard protocol following the bolus administration of intravenous contrast. RADIATION DOSE REDUCTION: This exam was performed according to the departmental dose-optimization program which includes automated exposure control, adjustment of the mA and/or kV according to patient size and/or use of iterative reconstruction technique. CONTRAST:  74mL  ISOVUE-300 IOPAMIDOL (ISOVUE-300) INJECTION 61% COMPARISON:  None Available. FINDINGS: Pharynx and larynx: No mucosal or submucosal lesion. Salivary glands: Submandibular glands are normal. Right parotid gland contains a few small nodules or lymph nodes, the largest at the caudal tip measuring 11 mm in maximal dimension. On the left, there are a few small intraparotid nodules or lymph nodes, and a dominant nodule at the caudal tip of the superficial lobe measuring 3 x 2.5 x 1.5 cm. This as well-circumscribed but does show slightly heterogeneous internal density. Differential diagnosis remains lymph nodes and or Warthin's tumors. Pleomorphic adenoma not excluded. Thyroid: Normal Lymph nodes: Otherwise bilateral cervical chain lymph nodes are within normal limits in size and density. Vascular: No abnormal vascular finding. Limited intracranial: Normal Visualized orbits: Normal Mastoids and visualized paranasal sinuses: Clear Skeleton: Ordinary cervical spondylosis. Upper chest: Lung apices are clear. Other: Incidental 4 x 2 x 2 cm lipoma of the lower sternocleidomastoid muscle on the left. IMPRESSION: 1. 3 x 2.5 x 1.5 cm well-circumscribed nodule at the caudal tip of the superficial lobe of the left parotid gland. Several other smaller intraparotid nodules on both sides that are indeterminate for small parotid lymph nodes versus Warthin's tumors. Differential diagnosis for the dominant lesion on the left is lymph node, dominant Warthin's tumor and pleomorphic adenoma. In a patient with a history squamous cell carcinoma of the face, metastatic nodal involvement is to be excluded. 2. 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 Electronically Signed   By: Paulina Fusi M.D.   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  Biopsy confirmed squamous cell carcinoma which is likely metastatic from his skin.  Will get a PET scan for further evaluation and to complete the staging workup.  Patient may benefit from concurrent chemotherapy and XRT.  Return to clinic 2 to 3 days after his PET scan to discuss the results and treatment planning.  Patient also have consultation with radiation oncology on that day.  I spent a total of 60 minutes reviewing chart data, face-to-face evaluation with the patient, counseling and coordination of care as detailed above.   Patient expressed understanding and was in agreement with this plan. He also understands that He can call clinic at any time with any questions, concerns,  or complaints.    Cancer Staging  No matching staging information was found for the patient.  Jeralyn Ruths, MD   03/26/2023 12:30 PM

## 2023-04-03 ENCOUNTER — Inpatient Hospital Stay: Payer: Medicare PPO

## 2023-04-04 ENCOUNTER — Ambulatory Visit
Admission: RE | Admit: 2023-04-04 | Discharge: 2023-04-04 | Disposition: A | Discharge status: Home / Self Care | Payer: Medicare PPO | Source: Ambulatory Visit | Attending: Oncology | Admitting: Oncology

## 2023-04-04 ENCOUNTER — Encounter
Admission: RE | Admit: 2023-04-04 | Discharge: 2023-04-04 | Disposition: A | Discharge status: Home / Self Care | Payer: Medicare PPO | Source: Ambulatory Visit | Attending: Oncology | Admitting: Oncology

## 2023-04-04 DIAGNOSIS — I728 Aneurysm of other specified arteries: Secondary | ICD-10-CM | POA: Diagnosis not present

## 2023-04-04 DIAGNOSIS — C07 Malignant neoplasm of parotid gland: Secondary | ICD-10-CM | POA: Insufficient documentation

## 2023-04-04 DIAGNOSIS — N200 Calculus of kidney: Secondary | ICD-10-CM | POA: Diagnosis not present

## 2023-04-04 DIAGNOSIS — K449 Diaphragmatic hernia without obstruction or gangrene: Secondary | ICD-10-CM | POA: Diagnosis not present

## 2023-04-04 DIAGNOSIS — K118 Other diseases of salivary glands: Secondary | ICD-10-CM | POA: Insufficient documentation

## 2023-04-04 DIAGNOSIS — I517 Cardiomegaly: Secondary | ICD-10-CM | POA: Insufficient documentation

## 2023-04-04 LAB — GLUCOSE, CAPILLARY: Glucose-Capillary: 121 mg/dL — ABNORMAL HIGH (ref 70–99)

## 2023-04-04 MED ORDER — GADOBUTROL 1 MMOL/ML IV SOLN
10.0000 mL | Freq: Once | INTRAVENOUS | Status: AC | PRN
  Administered 2023-04-04: 10 mL via INTRAVENOUS

## 2023-04-04 MED ORDER — FLUDEOXYGLUCOSE F - 18 (FDG) INJECTION
12.0000 | Freq: Once | INTRAVENOUS | Status: AC | PRN
  Administered 2023-04-04: 12.95 via INTRAVENOUS

## 2023-04-15 ENCOUNTER — Ambulatory Visit
Admission: RE | Admit: 2023-04-15 | Discharge: 2023-04-15 | Disposition: A | Discharge status: Home / Self Care | Payer: Medicare PPO | Source: Ambulatory Visit | Attending: Radiation Oncology | Admitting: Radiation Oncology

## 2023-04-15 ENCOUNTER — Inpatient Hospital Stay: Payer: Medicare PPO | Admitting: Oncology

## 2023-04-15 ENCOUNTER — Encounter: Payer: Self-pay | Admitting: Oncology

## 2023-04-15 ENCOUNTER — Encounter: Payer: Self-pay | Admitting: Radiation Oncology

## 2023-04-15 VITALS — BP 173/96 | HR 75 | Temp 97.7°F | Resp 17 | Wt 289.6 lb

## 2023-04-15 DIAGNOSIS — I10 Essential (primary) hypertension: Secondary | ICD-10-CM | POA: Insufficient documentation

## 2023-04-15 DIAGNOSIS — Z809 Family history of malignant neoplasm, unspecified: Secondary | ICD-10-CM | POA: Insufficient documentation

## 2023-04-15 DIAGNOSIS — M109 Gout, unspecified: Secondary | ICD-10-CM | POA: Insufficient documentation

## 2023-04-15 DIAGNOSIS — Z85818 Personal history of malignant neoplasm of other sites of lip, oral cavity, and pharynx: Secondary | ICD-10-CM | POA: Insufficient documentation

## 2023-04-15 DIAGNOSIS — Z79899 Other long term (current) drug therapy: Secondary | ICD-10-CM | POA: Insufficient documentation

## 2023-04-15 DIAGNOSIS — K118 Other diseases of salivary glands: Secondary | ICD-10-CM

## 2023-04-15 DIAGNOSIS — C07 Malignant neoplasm of parotid gland: Secondary | ICD-10-CM | POA: Insufficient documentation

## 2023-04-15 DIAGNOSIS — K219 Gastro-esophageal reflux disease without esophagitis: Secondary | ICD-10-CM | POA: Insufficient documentation

## 2023-04-15 DIAGNOSIS — G4730 Sleep apnea, unspecified: Secondary | ICD-10-CM | POA: Insufficient documentation

## 2023-04-15 MED ORDER — ONDANSETRON HCL 8 MG PO TABS
8.0000 mg | ORAL_TABLET | Freq: Three times a day (TID) | ORAL | 1 refills | Status: DC | PRN
Start: 2023-04-15 — End: 2024-09-29

## 2023-04-15 MED ORDER — PROCHLORPERAZINE MALEATE 10 MG PO TABS
10.0000 mg | ORAL_TABLET | Freq: Four times a day (QID) | ORAL | 1 refills | Status: DC | PRN
Start: 2023-04-15 — End: 2024-09-29

## 2023-04-15 NOTE — Consult Note (Signed)
NEW PATIENT EVALUATION  Name: Douglas Daugherty  MRN: [id number]  Date:   04/15/2023     DOB: 11-11-1956   This 67 y.o. 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  REFERRING PHYSICIAN: Jaclyn Shaggy, MD  CHIEF COMPLAINT:  Chief Complaint  Patient presents with   paraotid mass    DIAGNOSIS: The encounter diagnosis was Other diseases of salivary glands.   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  He recently presented with swelling in his left parotid bed.  CT scan of the head neck showed a 3 x 2.5 x 1.5 cm well-circumscribed nodule at the caudal tip of the superficial lobe of the left parotid gland.  Patient underwent core biopsy which was positive for poorly differentiated squamous cell carcinoma involving lymphoid tissue with differential including squamous cell carcinoma arising in the parotid versus intraparotid lymph node metastasis.  PET CT scan was performed showing hypermetabolic left parotid nodule with hypermetabolic bilateral level 2 3 and 5 lymph nodes no evidence of distant metastatic disease.  He had a small nodule in his right parotid gland hypermetabolic worrisome also for malignancy.  Patient is fairly asymptomatic except for the swelling in his left parotid bed.  Specifically denies any head and neck pain or dysphagia.  He has been seen by medical oncology with recommendation for concurrent chemoradiation.  PLANNED TREATMENT REGIMEN: Concurrent chemoradiation  PAST MEDICAL HISTORY:  has a past medical history of GERD (gastroesophageal reflux disease), Gout, Hypertension, PVC (premature ventricular contraction), Sleep apnea, and  Squamous cell carcinoma of face.    PAST SURGICAL HISTORY:  Past Surgical History:  Procedure Laterality Date   KNEE SURGERY  1974   PARATHYROIDECTOMY Left 03/29/2020   Procedure: PARATHYROIDECTOMY;  Surgeon: Linus Salmons, MD;  Location: [facility] ORS;  Service: ENT;  Laterality: Left;    FAMILY HISTORY: family history includes Cancer in his father, mother, and sister; Hypertension in his father and mother.  SOCIAL HISTORY:  reports that he has never smoked. He has never used smokeless tobacco. He reports current alcohol use. He reports that he does not use drugs.  ALLERGIES: Patient has no known allergies.  MEDICATIONS:  Current Outpatient Medications  Medication Sig Dispense Refill   acetaminophen (TYLENOL) 500 MG tablet Take 1,000 mg by mouth every 6 (six) hours as needed for moderate pain or headache.     allopurinol (ZYLOPRIM) 100 MG tablet Take 100 mg by mouth daily.     Carboxymethylcellulose Sodium (ARTIFICIAL TEARS OP) Place 1 drop into both eyes daily as needed (dry eyes).     carvedilol (COREG) 25 MG tablet Take 25 mg by mouth 2 (two) times daily.     chlorthalidone (HYGROTON) 25 MG tablet Take 25 mg by mouth every morning.     colchicine 0.6 MG tablet Take 0.6 mg by mouth 2 (two) times daily as needed (gout flare).      LORazepam (ATIVAN) 0.5 MG tablet Take 0.5 mg by mouth at bedtime as needed for sleep.     losartan (COZAAR) 100 MG tablet Take 100 mg by mouth daily.     Multiple Vitamin (MULTIVITAMIN WITH MINERALS) TABS tablet Take 1 tablet by mouth daily.  naproxen sodium (ALEVE) 220 MG tablet Take 220 mg by mouth 2 (two) times daily as needed (pain).     niacinamide 100 MG tablet Take 100 mg by mouth 2 (two) times daily with a meal.     ofloxacin (OCUFLOX) 0.3 % ophthalmic solution Place 2 drops into both eyes daily as needed (infection).  (Patient not taking: Reported on 03/26/2023)     omeprazole (PRILOSEC) 20 MG capsule Take 20 mg by mouth daily.     potassium  chloride SA (KLOR-CON M) 20 MEQ tablet Take 20 mEq by mouth daily.     prednisoLONE acetate (PRED FORTE) 1 % ophthalmic suspension Place 1 drop into both eyes daily as needed (allergies). (Patient not taking: Reported on 03/26/2023)     No current facility-administered medications for this encounter.    ECOG PERFORMANCE STATUS:  0 - Asymptomatic  REVIEW OF SYSTEMS: Patient denies any weight loss, fatigue, weakness, fever, chills or night sweats. Patient denies any loss of vision, blurred vision. Patient denies any ringing  of the ears or hearing loss. No irregular heartbeat. Patient denies heart murmur or history of fainting. Patient denies any chest pain or pain radiating to her upper extremities. Patient denies any shortness of breath, difficulty breathing at night, cough or hemoptysis. Patient denies any swelling in the lower legs. Patient denies any nausea vomiting, vomiting of blood, or coffee ground material in the vomitus. Patient denies any stomach pain. Patient states has had normal bowel movements no significant constipation or diarrhea. Patient denies any dysuria, hematuria or significant nocturia. Patient denies any problems walking, swelling in the joints or loss of balance. Patient denies any skin changes, loss of hair or loss of weight. Patient denies any excessive worrying or anxiety or significant depression. Patient denies any problems with insomnia. Patient denies excessive thirst, polyuria, polydipsia. Patient denies any swollen glands, patient denies easy bruising or easy bleeding. Patient denies any recent infections, allergies or URI. Patient "s visual fields have not changed significantly in recent time.   PHYSICAL EXAM: There were no vitals taken for this visit. Patient has definitive mass in his left parotid bed.  No discernible no adenopathy in the cervical or supraclavicular region is noted.  Well-developed well-nourished patient in NAD. HEENT reveals PERLA, EOMI, discs not  visualized.  Oral cavity is clear. No oral mucosal lesions are identified. Neck is clear without evidence of cervical or supraclavicular adenopathy. Lungs are clear to A&P. Cardiac examination is essentially unremarkable with regular rate and rhythm without murmur rub or thrill. Abdomen is benign with no organomegaly or masses noted. Motor sensory and DTR levels are equal and symmetric in the upper and lower extremities. Cranial nerves II through XII are grossly intact. Proprioception is intact. No peripheral adenopathy or edema is identified. No motor or sensory levels are noted. Crude visual fields are within normal range.  LABORATORY DATA: Pathology reports reviewed    RADIOLOGY RESULTS: Hands and PET CT scans reviewed compatible with above-stated findings   IMPRESSION: Locally advanced squamous cell carcinoma most likely of skin origin with parotid and bilateral neck involvement in 67 year old male  PLAN: At this time patient will benefit from concurrent chemoradiation.  Would plan on delivering 15 Gray to his areas of hypermetabolic activity in his parotid glands.  Would also treat hypermetabolic neck nodes to 60 Gray with remaining neck nodes receiving 54 Gray using IMRT dose painting technique.  Risks and benefits of treatment including loss of taste xerostomia oral mucositis skin reaction fatigue alteration  of blood counts all were discussed in detail with the patient.  He seems to comprehend my treatment plan well.  I have personally set up and ordered CT simulation.  There will be extra effort by both professional staff as well as technical staff to coordinate and manage concurrent chemoradiation and ensuing side effects during his treatments.   I would like to take this opportunity to thank you for allowing me to participate in the care of your patient.[name], MD

## 2023-04-15 NOTE — Progress Notes (Signed)
START ON PATHWAY REGIMEN - Head and Neck     A cycle is every 7 days:     Cisplatin   **Always confirm dose/schedule in your pharmacy ordering system**  Patient Characteristics: Other Sites, Preoperative or Nonsurgical Candidate, Stage III - IVB Disease Classification: Other Sites AJCC 8 Stage Grouping: IVA Therapeutic Status: Preoperative or Nonsurgical Candidate Intent of Therapy: Curative Intent, Discussed with Patient 

## 2023-04-15 NOTE — Progress Notes (Signed)
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  INTERVAL HISTORY: Patient returns to clinic today for further evaluation, discussion of his PET scan results, and treatment planning.  Currently feels well and is asymptomatic. He denies any dysphagia.  He has no neurologic complaints.  He denies any recent fevers or illnesses.  He has a good appetite and denies weight loss.  He has no chest pain, shortness of breath, cough, or hemoptysis.  He denies any nausea, vomiting, constipation, or diarrhea.  He has no urinary complaints.  Patient offers no further specific complaints today.  REVIEW OF SYSTEMS:   Review of Systems  Constitutional: Negative.  Negative for fever, malaise/fatigue and weight loss.  Respiratory: Negative.  Negative for cough, hemoptysis and shortness of breath.   Cardiovascular: Negative.  Negative for chest pain and leg swelling.  Gastrointestinal: Negative.  Negative for abdominal pain.  Genitourinary: Negative.  Negative for dysuria.  Musculoskeletal: Negative.  Negative for back pain.  Skin: Negative.  Negative for rash.  Neurological: Negative.  Negative for dizziness, focal weakness, weakness and headaches.  Psychiatric/Behavioral: Negative.  The patient is not nervous/anxious.     As per HPI. Otherwise, a complete review of systems is negative.  PAST MEDICAL HISTORY: Past Medical History:  Diagnosis Date   GERD (gastroesophageal reflux disease)    Gout    Hypertension    PVC (premature ventricular contraction)    Sleep apnea    Squamous cell carcinoma of face     PAST SURGICAL HISTORY: Past Surgical History:  Procedure Laterality Date   KNEE SURGERY  1974   PARATHYROIDECTOMY Left 03/29/2020   Procedure:  PARATHYROIDECTOMY;  Surgeon: Linus Salmons, MD;  Location: [facility] ORS;  Service: ENT;  Laterality: Left;    FAMILY HISTORY: Family History  Problem Relation Age of Onset   Hypertension Mother    Cancer Mother        skin cancer   Hypertension Father    Cancer Father        skin   Cancer Sister        skin cancer    ADVANCED DIRECTIVES (Y/N):  N  HEALTH MAINTENANCE: Social History   Tobacco Use   Smoking status: Never   Smokeless tobacco: Never  Substance Use Topics   Alcohol use: Yes    Comment: occasional   Drug use: Never     Colonoscopy:  PAP:  Bone density:  Lipid panel:  No Known Allergies  Current Outpatient Medications  Medication Sig Dispense Refill   acetaminophen (TYLENOL) 500 MG tablet Take 1,000 mg by mouth every 6 (six) hours as needed for moderate pain or headache.     allopurinol (ZYLOPRIM) 100 MG tablet Take 100 mg by mouth daily.     Carboxymethylcellulose Sodium (ARTIFICIAL TEARS OP) Place 1 drop into both eyes daily as needed (dry eyes).     carvedilol (COREG) 25 MG tablet Take 25 mg by mouth 2 (two) times daily.     chlorthalidone (HYGROTON) 25 MG tablet Take 25 mg by mouth every morning.     colchicine 0.6 MG tablet Take 0.6 mg by mouth 2 (two) times daily as needed (gout flare).      LORazepam (ATIVAN) 0.5 MG tablet Take 0.5 mg by mouth at bedtime as  needed for sleep.     losartan (COZAAR) 100 MG tablet Take 100 mg by mouth daily.     Multiple Vitamin (MULTIVITAMIN WITH MINERALS) TABS tablet Take 1 tablet by mouth daily.     naproxen sodium (ALEVE) 220 MG tablet Take 220 mg by mouth 2 (two) times daily as needed (pain).     niacinamide 100 MG tablet Take 100 mg by mouth 2 (two) times daily with a meal.     omeprazole (PRILOSEC) 20 MG capsule Take 20 mg by mouth daily.     potassium chloride SA (KLOR-CON M) 20 MEQ tablet Take 20 mEq by mouth daily.     ofloxacin (OCUFLOX) 0.3 % ophthalmic solution Place 2 drops into both eyes daily as needed  (infection).  (Patient not taking: Reported on 03/26/2023)     ondansetron (ZOFRAN) 8 MG tablet Take 1 tablet (8 mg total) by mouth every 8 (eight) hours as needed for nausea or vomiting. Start on the third day after cisplatin. 30 tablet 1   prednisoLONE acetate (PRED FORTE) 1 % ophthalmic suspension Place 1 drop into both eyes daily as needed (allergies). (Patient not taking: Reported on 03/26/2023)     prochlorperazine (COMPAZINE) 10 MG tablet Take 1 tablet (10 mg total) by mouth every 6 (six) hours as needed (Nausea or vomiting). 30 tablet 1   No current facility-administered medications for this visit.    OBJECTIVE: Vitals:   04/15/23 0927  BP: (!) 173/96  Pulse: 75  Resp: 17  Temp: 97.7 F (36.5 C)  SpO2: 98%     Body mass index is 39.28 kg/m.    ECOG FS:0 - Asymptomatic  General: Well-developed, well-nourished, no acute distress. Eyes: Pink conjunctiva, anicteric sclera. HEENT: Normocephalic, moist mucous membranes.  Easily palpable 1 to 2 cm left neck mass. Lungs: No audible wheezing or coughing. Heart: Regular rate and rhythm. Abdomen: Soft, nontender, no obvious distention. Musculoskeletal: No edema, cyanosis, or clubbing. Neuro: Alert, answering all questions appropriately. Cranial nerves grossly intact. Skin: No rashes or petechiae noted. Psych: Normal affect.   LAB RESULTS:  Lab Results  Component Value Date   NA 139 03/29/2020   K 3.3 (L) 03/29/2020   CL 102 03/29/2020   CO2 26 03/29/2020   GLUCOSE 142 (H) 03/29/2020   BUN 16 03/29/2020   CREATININE 1.13 03/29/2020   CALCIUM 10.3 03/29/2020   GFRNONAA >60 03/29/2020   GFRAA >60 03/29/2020    Lab Results  Component Value Date   WBC 8.6 03/29/2020   HGB 16.7 03/29/2020   HCT 46.1 03/29/2020   MCV 90.9 03/29/2020   PLT 291 03/29/2020     STUDIES: MR Brain W Wo Contrast  Result Date: 04/08/2023 CLINICAL DATA:  Staging for squamous cell carcinoma of the parotid gland. EXAM: MRI HEAD WITHOUT AND WITH  CONTRAST TECHNIQUE: Multiplanar, multiecho pulse sequences of the brain and surrounding structures were obtained without and with intravenous contrast. CONTRAST:  10mL GADAVIST GADOBUTROL 1 MMOL/ML IV SOLN COMPARISON:  Neck CT 03/07/2023. FINDINGS: Brain: No acute infarct or hemorrhage. No hydrocephalus or extra-axial collection. No foci of abnormal susceptibility. No mass or abnormal enhancement within the brain or along the course of the left facial nerve. Vascular: Normal flow voids and enhancement. Skull and upper cervical spine: Normal marrow signal and enhancement. Sinuses/Orbits: Unremarkable. Other: Partially visualized heterogeneously enhancing lesion in the inferior aspect of the left parotid gland, consistent with known squamous cell carcinoma. IMPRESSION: 1. No evidence of intracranial metastatic disease or perineural spread  along the left facial nerve. 2. Partially visualized heterogeneously enhancing lesion in the inferior aspect of the left parotid gland, consistent with known squamous cell carcinoma. Electronically Signed   By: Orvan Falconer M.D.   On: 04/08/2023 14:05   NM PET Image Restag (PS) Skull Base To Thigh  Result Date: 04/08/2023 CLINICAL DATA:  Initial treatment strategy for gross cell carcinoma of the parotid gland. EXAM: NUCLEAR MEDICINE PET SKULL BASE TO THIGH TECHNIQUE: 13.0 mCi F-18 FDG was injected intravenously. Full-ring PET imaging was performed from the skull base to thigh after the radiotracer. CT data was obtained and used for attenuation correction and anatomic localization. Fasting blood glucose: 121 mg/dl COMPARISON:  CT neck 16/09/9603. FINDINGS: Mediastinal blood pool activity: SUV max 2.9 Liver activity: SUV max NA NECK: Hypermetabolic 1.3 x 1.9 cm left parotid nodule (4/27), SUV max 15.8. Right parotid nodule measures 7 x 8 mm (4/24), SUV max 2.7. Metabolic bilateral level II/III and V lymph nodes. Hypermetabolic left level V lymph node measures 11 mm (4/31), SUV  max 4.4. Symmetric bilateral tonsillar uptake. Incidental CT findings: None. CHEST: No abnormal hypermetabolism. Incidental CT findings: Heart is enlarged. No pericardial or pleural effusion. Moderate hiatal hernia. ABDOMEN/PELVIS: No abnormal hypermetabolism. Incidental CT findings: Liver, gallbladder and adrenal glands are unremarkable. Tiny stone in the right kidney. Low-attenuation lesions in the kidneys. No specific follow-up necessary. Spleen, pancreas, stomach and bowel are grossly unremarkable. 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 SKELETON: No abnormal hypermetabolism. Incidental CT findings: Degenerative changes in the spine. IMPRESSION: 1. Hypermetabolic left parotid nodule with hypermetabolic bilateral level II, III and V lymph nodes. No evidence of distant metastatic disease. 2. Small nodule in the right parotid gland shows metabolism in the region of blood pool, worrisome for malignancy. 3. No evidence of distant metastatic disease. 4. Moderate hiatal hernia. 5. Tiny right renal stone. 6. Distal superior mesenteric artery aneurysm. Electronically Signed   By: Leanna Battles M.D.   On: 04/08/2023 10:17   [place] CORE BIOPSY (SALIVARY GLAND/PAROTID GLAND)  Result Date: 03/19/2023 INDICATION: 67 year old male referred for biopsy of left parotid gland mass EXAM: SALIVARY GLAND CORE BIOPSY MEDICATIONS: None. ANESTHESIA/SEDATION: None FLUOROSCOPY TIME:  None COMPLICATIONS: None PROCEDURE: Informed written consent was obtained from the patient after a thorough discussion of the procedural risks, benefits and alternatives. All questions were addressed. Maximal Sterile Barrier Technique was utilized including caps, mask, sterile gowns, sterile gloves, sterile drape, hand hygiene and skin antiseptic. A timeout was performed prior to the initiation of the procedure. Ultrasound survey was performed with images stored and sent to PACs. The left neck was prepped with chlorhexidine in a sterile  fashion, and a sterile drape was applied covering the operative field. A sterile gown and sterile gloves were used for the procedure. Local anesthesia was provided with 1% Lidocaine. Ultrasound guidance was used to infiltrate the region with 1% lidocaine for local anesthesia. Three separate 18 gauge core needle biopsy were then acquired of the left parotid nodule using ultrasound guidance. Images were stored. Final image was stored after biopsy. Patient tolerated the procedure well and remained hemodynamically stable throughout. No complications were encountered and no significant blood loss was encounter IMPRESSION: Status post ultrasound-guided biopsy of left parotid mass. Signed, Yvone Neu. Miachel Roux, RPVI [facility] Electronically Signed   By: Gilmer Mor D.O.   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  Biopsy confirmed squamous cell carcinoma.  PET scan results from April 04, 2023 reviewed independently as above with hypermetabolism in left parotid gland as well as several cervical lymph nodes.  Patient will benefit from concurrent XRT along with weekly cisplatin.  He has consultation with radiation oncology later this morning.  Patient has declined port placement.  Return to clinic on approximately May 8 for further evaluation and consideration of cycle 1 of weekly cisplatin.    I spent a total of 30 minutes reviewing chart data, face-to-face evaluation with the patient, counseling and coordination of care as detailed above.   Patient expressed understanding and was in agreement with this plan. He also understands that He can call clinic at any time with any questions, concerns, or complaints.     Cancer Staging  Primary squamous cell carcinoma of parotid gland [facility]) Staging form: Major Salivary Glands, AJCC 8th Edition - Clinical stage from 04/15/2023: Stage IVA (cT2, cN2b, cM0) - Signed by Jeralyn Ruths, MD on 04/15/2023   Jeralyn Ruths, MD   04/15/2023 12:29 PM

## 2023-04-16 ENCOUNTER — Other Ambulatory Visit: Payer: Self-pay

## 2023-04-17 ENCOUNTER — Inpatient Hospital Stay (HOSPITAL_BASED_OUTPATIENT_CLINIC_OR_DEPARTMENT_OTHER): Payer: Medicare PPO | Admitting: Hospice and Palliative Medicine

## 2023-04-17 DIAGNOSIS — Z808 Family history of malignant neoplasm of other organs or systems: Secondary | ICD-10-CM | POA: Insufficient documentation

## 2023-04-17 DIAGNOSIS — N289 Disorder of kidney and ureter, unspecified: Secondary | ICD-10-CM | POA: Insufficient documentation

## 2023-04-17 DIAGNOSIS — E876 Hypokalemia: Secondary | ICD-10-CM | POA: Insufficient documentation

## 2023-04-17 DIAGNOSIS — Z5111 Encounter for antineoplastic chemotherapy: Secondary | ICD-10-CM | POA: Insufficient documentation

## 2023-04-17 DIAGNOSIS — Z79630 Long term (current) use of alkylating agent: Secondary | ICD-10-CM | POA: Insufficient documentation

## 2023-04-17 DIAGNOSIS — C07 Malignant neoplasm of parotid gland: Secondary | ICD-10-CM | POA: Insufficient documentation

## 2023-04-17 NOTE — Progress Notes (Signed)
Multidisciplinary Oncology Council Documentation  Douglas Daugherty was presented by our [facility] on 04/17/2023, which included representatives from:  Palliative Care Dietitian  Physical/Occupational Therapist Nurse Navigator Genetics Speech Therapist Social work Survivorship RN Financial Navigator Research RN   Douglas Daugherty currently presents with history of parotid cancer  We reviewed previous medical and familial history, history of present illness, and recent lab results along with all available histopathologic and imaging studies. The MOC considered available treatment options and made the following recommendations/referrals:  SW, nutrition  The MOC is a meeting of clinicians from various specialty areas who evaluate and discuss patients for whom a multidisciplinary approach is being considered. Final determinations in the plan of care are those of the provider(s).   Today's extended care, comprehensive team conference, Douglas Daugherty was not present for the discussion and was not examined.

## 2023-04-18 ENCOUNTER — Ambulatory Visit
Admission: RE | Admit: 2023-04-18 | Discharge: 2023-04-18 | Disposition: A | Discharge status: Home / Self Care | Payer: Medicare PPO | Source: Ambulatory Visit | Attending: Radiation Oncology | Admitting: Radiation Oncology

## 2023-04-18 DIAGNOSIS — Z5111 Encounter for antineoplastic chemotherapy: Secondary | ICD-10-CM | POA: Diagnosis present

## 2023-04-18 DIAGNOSIS — Z510 Encounter for antineoplastic radiation therapy: Secondary | ICD-10-CM | POA: Insufficient documentation

## 2023-04-18 DIAGNOSIS — E876 Hypokalemia: Secondary | ICD-10-CM | POA: Diagnosis not present

## 2023-04-18 DIAGNOSIS — C07 Malignant neoplasm of parotid gland: Secondary | ICD-10-CM | POA: Diagnosis not present

## 2023-04-18 DIAGNOSIS — Z808 Family history of malignant neoplasm of other organs or systems: Secondary | ICD-10-CM | POA: Insufficient documentation

## 2023-04-19 ENCOUNTER — Other Ambulatory Visit: Payer: Self-pay

## 2023-04-19 ENCOUNTER — Inpatient Hospital Stay: Payer: Medicare PPO

## 2023-04-23 MED FILL — Fosaprepitant Dimeglumine For IV Infusion 150 MG (Base Eq): INTRAVENOUS | Qty: 5 | Status: AC

## 2023-04-23 MED FILL — Dexamethasone Sodium Phosphate Inj 100 MG/10ML: INTRAMUSCULAR | Qty: 1 | Status: AC

## 2023-04-24 ENCOUNTER — Inpatient Hospital Stay: Payer: Medicare PPO

## 2023-04-24 ENCOUNTER — Encounter: Payer: Self-pay | Admitting: Oncology

## 2023-04-24 ENCOUNTER — Inpatient Hospital Stay: Payer: Medicare PPO | Admitting: Oncology

## 2023-04-24 DIAGNOSIS — C07 Malignant neoplasm of parotid gland: Secondary | ICD-10-CM

## 2023-04-24 DIAGNOSIS — Z510 Encounter for antineoplastic radiation therapy: Secondary | ICD-10-CM | POA: Diagnosis not present

## 2023-04-24 LAB — CBC WITH DIFFERENTIAL (CANCER CENTER ONLY)
Abs Immature Granulocytes: 0.03 10*3/uL (ref 0.00–0.07)
Basophils Absolute: 0 10*3/uL (ref 0.0–0.1)
Basophils Relative: 1 %
Eosinophils Absolute: 0.2 10*3/uL (ref 0.0–0.5)
Eosinophils Relative: 2 %
HCT: 45.5 % (ref 39.0–52.0)
Hemoglobin: 15.7 g/dL (ref 13.0–17.0)
Immature Granulocytes: 0 %
Lymphocytes Relative: 21 %
Lymphs Abs: 1.7 10*3/uL (ref 0.7–4.0)
MCH: 32.4 pg (ref 26.0–34.0)
MCHC: 34.5 g/dL (ref 30.0–36.0)
MCV: 94 fL (ref 80.0–100.0)
Monocytes Absolute: 0.7 10*3/uL (ref 0.1–1.0)
Monocytes Relative: 8 %
Neutro Abs: 5.7 10*3/uL (ref 1.7–7.7)
Neutrophils Relative %: 68 %
Platelet Count: 251 10*3/uL (ref 150–400)
RBC: 4.84 MIL/uL (ref 4.22–5.81)
RDW: 13.2 % (ref 11.5–15.5)
WBC Count: 8.4 10*3/uL (ref 4.0–10.5)
nRBC: 0 % (ref 0.0–0.2)

## 2023-04-24 LAB — BASIC METABOLIC PANEL - CANCER CENTER ONLY
Anion gap: 10 (ref 5–15)
BUN: 17 mg/dL (ref 8–23)
CO2: 31 mmol/L (ref 22–32)
Calcium: 8.8 mg/dL — ABNORMAL LOW (ref 8.9–10.3)
Chloride: 96 mmol/L — ABNORMAL LOW (ref 98–111)
Creatinine: 1.27 mg/dL — ABNORMAL HIGH (ref 0.61–1.24)
GFR, Estimated: >60 mL/min (ref >60)
Glucose, Bld: 202 mg/dL — ABNORMAL HIGH (ref 70–99)
Potassium: 3.2 mmol/L — ABNORMAL LOW (ref 3.5–5.1)
Sodium: 137 mmol/L (ref 135–145)

## 2023-04-24 LAB — MAGNESIUM: Magnesium: 1.4 mg/dL — ABNORMAL LOW (ref 1.7–2.4)

## 2023-04-24 MED ORDER — POTASSIUM CHLORIDE IN NACL 20-0.9 MEQ/L-% IV SOLN
Freq: Once | INTRAVENOUS | Status: AC
  Filled 2023-04-24: qty 1000

## 2023-04-24 MED ORDER — SODIUM CHLORIDE 0.9 % IV SOLN
10.0000 mg | Freq: Once | INTRAVENOUS | Status: AC
  Administered 2023-04-24: 10 mg via INTRAVENOUS
  Filled 2023-04-24: qty 10

## 2023-04-24 MED ORDER — MAGNESIUM SULFATE 2 GM/50ML IV SOLN
2.0000 g | Freq: Once | INTRAVENOUS | Status: AC
  Administered 2023-04-24: 2 g via INTRAVENOUS

## 2023-04-24 MED ORDER — SODIUM CHLORIDE 0.9 % IV SOLN
Freq: Once | INTRAVENOUS | Status: AC
  Filled 2023-04-24: qty 250

## 2023-04-24 MED ORDER — PALONOSETRON HCL INJECTION 0.25 MG/5ML
0.2500 mg | Freq: Once | INTRAVENOUS | Status: AC
  Administered 2023-04-24: 0.25 mg via INTRAVENOUS
  Filled 2023-04-24: qty 5

## 2023-04-24 MED ORDER — SODIUM CHLORIDE 0.9 % IV SOLN
150.0000 mg | Freq: Once | INTRAVENOUS | Status: AC
  Administered 2023-04-24: 150 mg via INTRAVENOUS
  Filled 2023-04-24: qty 150

## 2023-04-24 MED ORDER — POTASSIUM CHLORIDE CRYS ER 20 MEQ PO TBCR: 20.0000 meq | EXTENDED_RELEASE_TABLET | Freq: Two times a day (BID) | ORAL | 1 refills | Status: DC

## 2023-04-24 MED ORDER — MAGNESIUM SULFATE 2 GM/50ML IV SOLN
2.0000 g | Freq: Once | INTRAVENOUS | Status: AC
  Administered 2023-04-24: 2 g via INTRAVENOUS
  Filled 2023-04-24: qty 50

## 2023-04-24 MED ORDER — SODIUM CHLORIDE 0.9 % IV SOLN
40.0000 mg/m2 | Freq: Once | INTRAVENOUS | Status: AC
  Administered 2023-04-24: 100 mg via INTRAVENOUS
  Filled 2023-04-24: qty 100

## 2023-04-24 NOTE — Progress Notes (Signed)
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  INTERVAL HISTORY: Patient returns to clinic today for further evaluation and initiation of cycle 1 of weekly cisplatin.  He will initiate XRT in several days.  He continues to feel well and remains asymptomatic. He denies any dysphagia.  He has no neurologic complaints.  He denies any recent fevers or illnesses.  He has a good appetite and denies weight loss.  He has no chest pain, shortness of breath, cough, or hemoptysis.  He denies any nausea, vomiting, constipation, or diarrhea.  He has no urinary complaints.  Patient offers no specific complaints today.  REVIEW OF SYSTEMS:   Review of Systems  Constitutional: Negative.  Negative for fever, malaise/fatigue and weight loss.  Respiratory: Negative.  Negative for cough, hemoptysis and shortness of breath.   Cardiovascular: Negative.  Negative for chest pain and leg swelling.  Gastrointestinal: Negative.  Negative for abdominal pain.  Genitourinary: Negative.  Negative for dysuria.  Musculoskeletal: Negative.  Negative for back pain.  Skin: Negative.  Negative for rash.  Neurological: Negative.  Negative for dizziness, focal weakness, weakness and headaches.  Psychiatric/Behavioral: Negative.  The patient is not nervous/anxious.     As per HPI. Otherwise, a complete review of systems is negative.  PAST MEDICAL HISTORY: Past Medical History:  Diagnosis Date   GERD (gastroesophageal reflux disease)    Gout    Hypertension    PVC (premature ventricular contraction)    Sleep apnea    Squamous cell carcinoma of face     PAST SURGICAL HISTORY: Past Surgical History:  Procedure Laterality Date   KNEE SURGERY  1974   PARATHYROIDECTOMY Left  03/29/2020   Procedure: PARATHYROIDECTOMY;  Surgeon: Linus Salmons, MD;  Location: [facility] ORS;  Service: ENT;  Laterality: Left;    FAMILY HISTORY: Family History  Problem Relation Age of Onset   Hypertension Mother    Cancer Mother        skin cancer   Hypertension Father    Cancer Father        skin   Cancer Sister        skin cancer    ADVANCED DIRECTIVES (Y/N):  N  HEALTH MAINTENANCE: Social History   Tobacco Use   Smoking status: Never   Smokeless tobacco: Never  Substance Use Topics   Alcohol use: Yes    Comment: occasional   Drug use: Never     Colonoscopy:  PAP:  Bone density:  Lipid panel:  No Known Allergies  Current Outpatient Medications  Medication Sig Dispense Refill   acetaminophen (TYLENOL) 500 MG tablet Take 1,000 mg by mouth every 6 (six) hours as needed for moderate pain or headache.     allopurinol (ZYLOPRIM) 100 MG tablet Take 100 mg by mouth daily.     Carboxymethylcellulose Sodium (ARTIFICIAL TEARS OP) Place 1 drop into both eyes daily as needed (dry eyes).     carvedilol (COREG) 25 MG tablet Take 25 mg by mouth 2 (two) times daily.     chlorthalidone (HYGROTON) 25 MG tablet Take 25 mg by mouth every morning.     colchicine 0.6 MG tablet Take 0.6 mg by mouth 2 (two) times daily as needed (gout flare).      LORazepam (ATIVAN) 0.5 MG tablet  Take 0.5 mg by mouth at bedtime as needed for sleep.     losartan (COZAAR) 100 MG tablet Take 100 mg by mouth daily.     Multiple Vitamin (MULTIVITAMIN WITH MINERALS) TABS tablet Take 1 tablet by mouth daily.     naproxen sodium (ALEVE) 220 MG tablet Take 220 mg by mouth 2 (two) times daily as needed (pain).     niacinamide 100 MG tablet Take 100 mg by mouth 2 (two) times daily with a meal.     omeprazole (PRILOSEC) 20 MG capsule Take 20 mg by mouth daily.     ondansetron (ZOFRAN) 8 MG tablet Take 1 tablet (8 mg total) by mouth every 8 (eight) hours as needed for nausea or vomiting. Start on the third day  after cisplatin. 30 tablet 1   prednisoLONE acetate (PRED FORTE) 1 % ophthalmic suspension Place 1 drop into both eyes daily as needed (allergies).     prochlorperazine (COMPAZINE) 10 MG tablet Take 1 tablet (10 mg total) by mouth every 6 (six) hours as needed (Nausea or vomiting). 30 tablet 1   ofloxacin (OCUFLOX) 0.3 % ophthalmic solution Place 2 drops into both eyes daily as needed (infection).  (Patient not taking: Reported on 03/26/2023)     potassium chloride SA (KLOR-CON M) 20 MEQ tablet Take 1 tablet (20 mEq total) by mouth 2 (two) times daily. 60 tablet 1   No current facility-administered medications for this visit.   Facility-Administered Medications Ordered in Other Visits  Medication Dose Route Frequency Provider Last Rate Last Admin   CISplatin (PLATINOL) 100 mg in sodium chloride 0.9 % 500 mL chemo infusion  40 mg/m2 (Treatment Plan Recorded) Intravenous Once Jeralyn Ruths, MD        OBJECTIVE: Vitals:   04/24/23 0846  BP: (!) 162/91  Pulse: 68  Resp: 18  Temp: 97.9 F (36.6 C)  SpO2: 99%     Body mass index is 40.14 kg/m.    ECOG FS:0 - Asymptomatic  General: Well-developed, well-nourished, no acute distress. Eyes: Pink conjunctiva, anicteric sclera. HEENT: Normocephalic, moist mucous membranes.  Easily palpable 1 to 2 cm left neck mass. Lungs: No audible wheezing or coughing. Heart: Regular rate and rhythm. Abdomen: Soft, nontender, no obvious distention. Musculoskeletal: No edema, cyanosis, or clubbing. Neuro: Alert, answering all questions appropriately. Cranial nerves grossly intact. Skin: No rashes or petechiae noted. Psych: Normal affect.  LAB RESULTS:  Lab Results  Component Value Date   NA 137 04/24/2023   K 3.2 (L) 04/24/2023   CL 96 (L) 04/24/2023   CO2 31 04/24/2023   GLUCOSE 202 (H) 04/24/2023   BUN 17 04/24/2023   CREATININE 1.27 (H) 04/24/2023   CALCIUM 8.8 (L) 04/24/2023   GFRNONAA >60 04/24/2023   GFRAA >60 03/29/2020    Lab  Results  Component Value Date   WBC 8.4 04/24/2023   NEUTROABS 5.7 04/24/2023   HGB 15.7 04/24/2023   HCT 45.5 04/24/2023   MCV 94.0 04/24/2023   PLT 251 04/24/2023     STUDIES: MR Brain W Wo Contrast  Result Date: 04/08/2023 CLINICAL DATA:  Staging for squamous cell carcinoma of the parotid gland. EXAM: MRI HEAD WITHOUT AND WITH CONTRAST TECHNIQUE: Multiplanar, multiecho pulse sequences of the brain and surrounding structures were obtained without and with intravenous contrast. CONTRAST:  10mL GADAVIST GADOBUTROL 1 MMOL/ML IV SOLN COMPARISON:  Neck CT 03/07/2023. FINDINGS: Brain: No acute infarct or hemorrhage. No hydrocephalus or extra-axial collection. No foci of abnormal susceptibility. No mass  or abnormal enhancement within the brain or along the course of the left facial nerve. Vascular: Normal flow voids and enhancement. Skull and upper cervical spine: Normal marrow signal and enhancement. Sinuses/Orbits: Unremarkable. Other: Partially visualized heterogeneously enhancing lesion in the inferior aspect of the left parotid gland, consistent with known squamous cell carcinoma. IMPRESSION: 1. No evidence of intracranial metastatic disease or perineural spread along the left facial nerve. 2. Partially visualized heterogeneously enhancing lesion in the inferior aspect of the left parotid gland, consistent with known squamous cell carcinoma. Electronically Signed   By: Orvan Falconer M.D.   On: 04/08/2023 14:05   NM PET Image Restag (PS) Skull Base To Thigh  Result Date: 04/08/2023 CLINICAL DATA:  Initial treatment strategy for gross cell carcinoma of the parotid gland. EXAM: NUCLEAR MEDICINE PET SKULL BASE TO THIGH TECHNIQUE: 13.0 mCi F-18 FDG was injected intravenously. Full-ring PET imaging was performed from the skull base to thigh after the radiotracer. CT data was obtained and used for attenuation correction and anatomic localization. Fasting blood glucose: 121 mg/dl COMPARISON:  CT neck  65/78/4696. FINDINGS: Mediastinal blood pool activity: SUV max 2.9 Liver activity: SUV max NA NECK: Hypermetabolic 1.3 x 1.9 cm left parotid nodule (4/27), SUV max 15.8. Right parotid nodule measures 7 x 8 mm (4/24), SUV max 2.7. Metabolic bilateral level II/III and V lymph nodes. Hypermetabolic left level V lymph node measures 11 mm (4/31), SUV max 4.4. Symmetric bilateral tonsillar uptake. Incidental CT findings: None. CHEST: No abnormal hypermetabolism. Incidental CT findings: Heart is enlarged. No pericardial or pleural effusion. Moderate hiatal hernia. ABDOMEN/PELVIS: No abnormal hypermetabolism. Incidental CT findings: Liver, gallbladder and adrenal glands are unremarkable. Tiny stone in the right kidney. Low-attenuation lesions in the kidneys. No specific follow-up necessary. Spleen, pancreas, stomach and bowel are grossly unremarkable. 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 SKELETON: No abnormal hypermetabolism. Incidental CT findings: Degenerative changes in the spine. IMPRESSION: 1. Hypermetabolic left parotid nodule with hypermetabolic bilateral level II, III and V lymph nodes. No evidence of distant metastatic disease. 2. Small nodule in the right parotid gland shows metabolism in the region of blood pool, worrisome for malignancy. 3. No evidence of distant metastatic disease. 4. Moderate hiatal hernia. 5. Tiny right renal stone. 6. Distal superior mesenteric artery aneurysm. Electronically Signed   By: Leanna Battles M.D.   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  Biopsy confirmed squamous cell carcinoma.  PET scan results from April 04, 2023 reviewed independently with hypermetabolism in left parotid gland as well as several cervical  lymph nodes.  Patient will benefit from concurrent XRT along with weekly cisplatin.  Proceed with cycle 1 of weekly cisplatin today.  Patient will initiate XRT in the next several days.  Patient has declined port placement.  Return to clinic in 1 week for further evaluation and consideration of cycle 2. Hypokalemia: Patient has been instructed to increase his oral potassium dose to twice a day. Hypomagnesia: Patient will receive 4 g IV magnesium along with his treatment today. Renal insufficiency: Mild, patient's current creatinine is 1.27.  Monitor closely while giving cisplatin.   Patient expressed understanding and was in agreement with this plan. He also understands that He can call clinic at any  time with any questions, concerns, or complaints.    Cancer Staging  Primary squamous cell carcinoma of parotid gland [facility]) Staging form: Major Salivary Glands, AJCC 8th Edition - Clinical stage from 04/15/2023: Stage IVA (cT2, cN2b, cM0) - Signed by Jeralyn Ruths, MD on 04/15/2023   Jeralyn Ruths, MD   04/24/2023 12:42 PM

## 2023-04-24 NOTE — Progress Notes (Signed)
Nutrition Assessment:  New parotid gland cancer  67 year old male with stage IV SCC of parotid gland.  Past medical history of GERD, gouth, HTN, PVC, SCC of face.  Starting cisplatin and radiation.    Met with patient and son today during infusion.  Reports good/normal appetite.  Typically has fig newtons for breakfast with morning medication.  Around 11:30am goes out to eat at nearby restaurants ([employer], [employer], [employer]) and gets burger or hot dog and fries, chicken sandwich.  Dinner maybe pizza or what son prepares for dinner.  Denies any trouble chewing, swallowing at this time.  Able to consume all consistency of foods. Has 12-16 oz coffee in the am but mostly drinks water.   Medications: KCL, prilosec, compazine, zofran, MVI  Labs: K 3.2, glucose 202, creatinine 1.27  Anthropometrics:   Height: 72 inches Weight: 296 lb today 296 lb on 4/9 292 lb on 08/27/22 BMI: 40   NUTRITION DIAGNOSIS: Predicted sub optimal energy intake related to cancer related treatment side effects as evidenced by receiving concurrent chemo and radiation   INTERVENTION:  Discussed importance of good nutrition during treatment and weight maintenance Encouraged good sources of protein and discussed options Contact information provided    MONITORING, EVALUATION, GOAL: weight trends, intake   NEXT VISIT: Wednesday, May 22 during infusion  Andreanna Mikolajczak B. Freida Busman, RD, LDN Registered Dietitian [phone]

## 2023-04-24 NOTE — Patient Instructions (Signed)
[facility] [facility]  Discharge Instructions: Thank you for choosing [facility] to provide your oncology and hematology care.  If you have a lab appointment with the Cancer Center, please go directly to the Cancer Center and check in at the registration area.  Wear comfortable clothing and clothing appropriate for easy access to any Portacath or PICC line.   We strive to give you quality time with your provider. You may need to reschedule your appointment if you arrive late (15 or more minutes).  Arriving late affects you and other patients whose appointments are after yours.  Also, if you miss three or more appointments without notifying the office, you may be dismissed from the clinic at the provider's discretion.      For prescription refill requests, have your pharmacy contact our office and allow 72 hours for refills to be completed.    Today you received the following chemotherapy and/or immunotherapy agents: Cisplatin.   To help prevent nausea and vomiting after your treatment, we encourage you to take your nausea medication as directed.  BELOW ARE SYMPTOMS THAT SHOULD BE REPORTED IMMEDIATELY: *FEVER GREATER THAN 100.4 F (38 C) OR HIGHER *CHILLS OR SWEATING *NAUSEA AND VOMITING THAT IS NOT CONTROLLED WITH YOUR NAUSEA MEDICATION *UNUSUAL SHORTNESS OF BREATH *UNUSUAL BRUISING OR BLEEDING *URINARY PROBLEMS (pain or burning when urinating, or frequent urination) *BOWEL PROBLEMS (unusual diarrhea, constipation, pain near the anus) TENDERNESS IN MOUTH AND THROAT WITH OR WITHOUT PRESENCE OF ULCERS (sore throat, sores in mouth, or a toothache) UNUSUAL RASH, SWELLING OR PAIN   Items with * indicate a potential emergency and should be followed up as soon as possible or go to the Emergency Department if any problems should occur.  Please show the CHEMOTHERAPY ALERT CARD or IMMUNOTHERAPY ALERT CARD at check-in to the Emergency Department and triage  nurse.  Should you have questions after your visit or need to cancel or reschedule your appointment, please contact [facility]  [phone] and follow the prompts.  Office hours are 8:00 a.m. to 4:30 p.m. Monday - Friday. Please note that voicemails left after 4:00 p.m. may not be returned until the following business day.  We are closed weekends and major holidays. You have access to a nurse at all times for urgent questions. Please call the main number to the clinic [phone] and follow the prompts.  For any non-urgent questions, you may also contact your provider using MyChart. We now offer e-Visits for anyone 47 and older to request care online for non-urgent symptoms. For details visit [email or web address].   Also download the MyChart app! Go to the app store, search "MyChart", open the app, select [place], and log in with your MyChart username and password.

## 2023-04-24 NOTE — Progress Notes (Signed)
Per Dr. Orlie Dakin, OK to run post IV fluids + potassium with Cisplatin.

## 2023-04-25 ENCOUNTER — Inpatient Hospital Stay: Payer: Medicare PPO | Admitting: Licensed Clinical Social Worker

## 2023-04-25 ENCOUNTER — Telehealth: Payer: Self-pay

## 2023-04-25 DIAGNOSIS — Z510 Encounter for antineoplastic radiation therapy: Secondary | ICD-10-CM | POA: Diagnosis not present

## 2023-04-25 DIAGNOSIS — C07 Malignant neoplasm of parotid gland: Secondary | ICD-10-CM

## 2023-04-25 NOTE — Telephone Encounter (Signed)
Telephone call to patient for follow up after receiving first infusion.   Patient states infusion went great.  States eating good and drinking plenty of fluids.   Denies any nausea or vomiting.  Encouraged patient to call for any concerns or questions. 

## 2023-04-25 NOTE — Progress Notes (Signed)
     [facility] Clinical Social Work  Initial Assessment   Douglas Daugherty is a 67 y.o. year old male contacted by phone. Clinical Social Work was referred by medical provider for assessment of psychosocial needs.   SDOH (Social Determinants of Health) assessments performed: Yes SDOH Interventions    Flowsheet Row Clinical Support from 04/25/2023 in [facility]  SDOH Interventions   Food Insecurity Interventions Intervention Not Indicated  Housing Interventions Intervention Not Indicated  Transportation Interventions Intervention Not Indicated  Utilities Interventions Intervention Not Indicated  Alcohol Usage Interventions Intervention Not Indicated (Score <7)  Financial Strain Interventions Intervention Not Indicated  Physical Activity Interventions Intervention Not Indicated  Stress Interventions Intervention Not Indicated  Social Connections Interventions Intervention Not Indicated       SDOH Screenings   Food Insecurity: No Food Insecurity (04/25/2023)  Housing: Low Risk  (04/25/2023)  Transportation Needs: No Transportation Needs (04/25/2023)  Utilities: Not At Risk (04/25/2023)  Alcohol Screen: Low Risk  (04/25/2023)  Depression (PHQ2-9): Low Risk  (04/25/2023)  Financial Resource Strain: Low Risk  (04/25/2023)  Physical Activity: Sufficiently Active (04/25/2023)  Social Connections: Moderately Integrated (04/25/2023)  Stress: No Stress Concern Present (04/25/2023)  Tobacco Use: Low Risk  (04/24/2023)     Distress Screen completed: No     No data to display            Family/Social Information:  Housing Arrangement: patient lives [place] contact, son Taeo Shellhammer [phone]  Family members/support persons in your life? Family, Friends, Warehouse manager, Social worker, and Ambulance person concerns: no  Employment: Retired  .  Income source: Actor concerns: No Type of concern: None Food access concerns:  no Religious or spiritual practice: Yes-  Services Currently in place:  [employer] PPO  Coping/ Adjustment to diagnosis: Patient understands treatment plan and what happens next? yes Concerns about diagnosis and/or treatment: I'm not especially worried about anything Patient reported stressors:  no reported stressors Hopes and/or priorities: N/A Patient enjoys being outside and time with family/ friends Current coping skills/ strengths: Average or above average intelligence , Capable of independent living , Communication skills , Contractor , General fund of knowledge , Motivation for treatment/growth , Physical Health , Religious Affiliation , Special hobby/interest , Supportive family/friends , and Work skills     SUMMARY: Current SDOH Barriers:  No SDOH barriers present  Clinical Social Work Clinical Goal(s):  No clinical social work goals at this time  Interventions: Discussed common feeling and emotions when being diagnosed with cancer, and the importance of support during treatment Informed patient of the support team roles and support services at [facility] Provided CSW contact information and encouraged patient to call with any questions or concerns Provided patient with information about CSW role in patient care and other available resources.   Follow Up Plan: Patient will contact CSW with any support or resource needs Patient verbalizes understanding of plan: Yes    Joseph Art, LCSW Clinical Social Worker [facility]

## 2023-04-29 ENCOUNTER — Ambulatory Visit: Admission: RE | Admit: 2023-04-29 | Payer: Medicare PPO | Source: Ambulatory Visit

## 2023-04-30 ENCOUNTER — Ambulatory Visit
Admission: RE | Admit: 2023-04-30 | Discharge: 2023-04-30 | Disposition: A | Discharge status: Home / Self Care | Payer: Medicare PPO | Source: Ambulatory Visit | Attending: Radiation Oncology | Admitting: Radiation Oncology

## 2023-04-30 ENCOUNTER — Other Ambulatory Visit: Payer: Self-pay

## 2023-04-30 DIAGNOSIS — Z510 Encounter for antineoplastic radiation therapy: Secondary | ICD-10-CM | POA: Diagnosis not present

## 2023-04-30 LAB — RAD ONC ARIA SESSION SUMMARY
Course Elapsed Days: 0
Plan Fractions Treated to Date: 1
Plan Prescribed Dose Per Fraction: 2 Gy
Plan Total Fractions Prescribed: 33
Plan Total Prescribed Dose: 66 Gy
Reference Point Dosage Given to Date: 2 Gy
Reference Point Session Dosage Given: 2 Gy
Session Number: 1

## 2023-04-30 MED FILL — Dexamethasone Sodium Phosphate Inj 100 MG/10ML: INTRAMUSCULAR | Qty: 1 | Status: AC

## 2023-04-30 MED FILL — Fosaprepitant Dimeglumine For IV Infusion 150 MG (Base Eq): INTRAVENOUS | Qty: 5 | Status: AC

## 2023-05-01 ENCOUNTER — Ambulatory Visit
Admission: RE | Admit: 2023-05-01 | Discharge: 2023-05-01 | Disposition: A | Discharge status: Home / Self Care | Payer: Medicare PPO | Source: Ambulatory Visit | Attending: Radiation Oncology | Admitting: Radiation Oncology

## 2023-05-01 ENCOUNTER — Inpatient Hospital Stay (HOSPITAL_BASED_OUTPATIENT_CLINIC_OR_DEPARTMENT_OTHER): Payer: Medicare PPO | Admitting: Oncology

## 2023-05-01 ENCOUNTER — Encounter: Payer: Self-pay | Admitting: Oncology

## 2023-05-01 ENCOUNTER — Other Ambulatory Visit: Payer: Self-pay

## 2023-05-01 ENCOUNTER — Inpatient Hospital Stay: Payer: Medicare PPO

## 2023-05-01 DIAGNOSIS — C07 Malignant neoplasm of parotid gland: Secondary | ICD-10-CM | POA: Diagnosis not present

## 2023-05-01 DIAGNOSIS — Z510 Encounter for antineoplastic radiation therapy: Secondary | ICD-10-CM | POA: Diagnosis not present

## 2023-05-01 LAB — MAGNESIUM: Magnesium: 1.4 mg/dL — ABNORMAL LOW (ref 1.7–2.4)

## 2023-05-01 LAB — CBC WITH DIFFERENTIAL (CANCER CENTER ONLY)
Abs Immature Granulocytes: 0.08 10*3/uL — ABNORMAL HIGH (ref 0.00–0.07)
Basophils Absolute: 0.1 10*3/uL (ref 0.0–0.1)
Basophils Relative: 1 %
Eosinophils Absolute: 0.1 10*3/uL (ref 0.0–0.5)
Eosinophils Relative: 1 %
HCT: 44.3 % (ref 39.0–52.0)
Hemoglobin: 15.3 g/dL (ref 13.0–17.0)
Immature Granulocytes: 1 %
Lymphocytes Relative: 16 %
Lymphs Abs: 1.7 10*3/uL (ref 0.7–4.0)
MCH: 32.2 pg (ref 26.0–34.0)
MCHC: 34.5 g/dL (ref 30.0–36.0)
MCV: 93.3 fL (ref 80.0–100.0)
Monocytes Absolute: 0.8 10*3/uL (ref 0.1–1.0)
Monocytes Relative: 8 %
Neutro Abs: 7.6 10*3/uL (ref 1.7–7.7)
Neutrophils Relative %: 73 %
Platelet Count: 292 10*3/uL (ref 150–400)
RBC: 4.75 MIL/uL (ref 4.22–5.81)
RDW: 12.4 % (ref 11.5–15.5)
WBC Count: 10.3 10*3/uL (ref 4.0–10.5)
nRBC: 0 % (ref 0.0–0.2)

## 2023-05-01 LAB — BASIC METABOLIC PANEL - CANCER CENTER ONLY
Anion gap: 16 — ABNORMAL HIGH (ref 5–15)
BUN: 21 mg/dL (ref 8–23)
CO2: 24 mmol/L (ref 22–32)
Calcium: 9.1 mg/dL (ref 8.9–10.3)
Chloride: 96 mmol/L — ABNORMAL LOW (ref 98–111)
Creatinine: 1.27 mg/dL — ABNORMAL HIGH (ref 0.61–1.24)
GFR, Estimated: >60 mL/min (ref >60)
Glucose, Bld: 181 mg/dL — ABNORMAL HIGH (ref 70–99)
Potassium: 3.4 mmol/L — ABNORMAL LOW (ref 3.5–5.1)
Sodium: 136 mmol/L (ref 135–145)

## 2023-05-01 LAB — RAD ONC ARIA SESSION SUMMARY
Course Elapsed Days: 1
Plan Fractions Treated to Date: 2
Plan Prescribed Dose Per Fraction: 2 Gy
Plan Total Fractions Prescribed: 33
Plan Total Prescribed Dose: 66 Gy
Reference Point Dosage Given to Date: 4 Gy
Reference Point Session Dosage Given: 2 Gy
Session Number: 2

## 2023-05-01 MED ORDER — MAGNESIUM SULFATE 4 GM/100ML IV SOLN
4.0000 g | Freq: Once | INTRAVENOUS | Status: AC
  Administered 2023-05-01: 4 g via INTRAVENOUS
  Filled 2023-05-01: qty 100

## 2023-05-01 MED ORDER — SODIUM CHLORIDE 0.9 % IV SOLN
150.0000 mg | Freq: Once | INTRAVENOUS | Status: AC
  Administered 2023-05-01: 150 mg via INTRAVENOUS
  Filled 2023-05-01: qty 150

## 2023-05-01 MED ORDER — SODIUM CHLORIDE 0.9 % IV SOLN
40.0000 mg/m2 | Freq: Once | INTRAVENOUS | Status: AC
  Administered 2023-05-01: 100 mg via INTRAVENOUS
  Filled 2023-05-01: qty 100

## 2023-05-01 MED ORDER — POTASSIUM CHLORIDE IN NACL 20-0.9 MEQ/L-% IV SOLN
Freq: Once | INTRAVENOUS | Status: AC
  Filled 2023-05-01: qty 1000

## 2023-05-01 MED ORDER — SODIUM CHLORIDE 0.9 % IV SOLN
Freq: Once | INTRAVENOUS | Status: AC
  Filled 2023-05-01: qty 250

## 2023-05-01 MED ORDER — SODIUM CHLORIDE 0.9 % IV SOLN
10.0000 mg | Freq: Once | INTRAVENOUS | Status: AC
  Administered 2023-05-01: 10 mg via INTRAVENOUS
  Filled 2023-05-01: qty 10

## 2023-05-01 MED ORDER — PALONOSETRON HCL INJECTION 0.25 MG/5ML
0.2500 mg | Freq: Once | INTRAVENOUS | Status: AC
  Administered 2023-05-01: 0.25 mg via INTRAVENOUS
  Filled 2023-05-01: qty 5

## 2023-05-01 NOTE — Patient Instructions (Signed)
[place] [facility] [place] [facility]  Discharge Instructions: Thank you for choosing [facility] to provide your oncology and hematology care.  If you have a lab appointment with the Cancer Center, please go directly to the Cancer Center and check in at the registration area.  Wear comfortable clothing and clothing appropriate for easy access to any Portacath or PICC line.   We strive to give you quality time with your provider. You may need to reschedule your appointment if you arrive late (15 or more minutes).  Arriving late affects you and other patients whose appointments are after yours.  Also, if you miss three or more appointments without notifying the office, you may be dismissed from the clinic at the provider's discretion.      For prescription refill requests, have your pharmacy contact our office and allow 72 hours for refills to be completed.    Today you received the following chemotherapy and/or immunotherapy agents Cisplatin      To help prevent nausea and vomiting after your treatment, we encourage you to take your nausea medication as directed.  BELOW ARE SYMPTOMS THAT SHOULD BE REPORTED IMMEDIATELY: *FEVER GREATER THAN 100.4 F (38 C) OR HIGHER *CHILLS OR SWEATING *NAUSEA AND VOMITING THAT IS NOT CONTROLLED WITH YOUR NAUSEA MEDICATION *UNUSUAL SHORTNESS OF BREATH *UNUSUAL BRUISING OR BLEEDING *URINARY PROBLEMS (pain or burning when urinating, or frequent urination) *BOWEL PROBLEMS (unusual diarrhea, constipation, pain near the anus) TENDERNESS IN MOUTH AND THROAT WITH OR WITHOUT PRESENCE OF ULCERS (sore throat, sores in mouth, or a toothache) UNUSUAL RASH, SWELLING OR PAIN  UNUSUAL VAGINAL DISCHARGE OR ITCHING   Items with * indicate a potential emergency and should be followed up as soon as possible or go to the Emergency Department if any problems should occur.  Please show the CHEMOTHERAPY ALERT CARD or IMMUNOTHERAPY ALERT CARD at check-in to  the Emergency Department and triage nurse.  Should you have questions after your visit or need to cancel or reschedule your appointment, please contact [facility] [place] [facility]  [phone] and follow the prompts.  Office hours are 8:00 a.m. to 4:30 p.m. Monday - Friday. Please note that voicemails left after 4:00 p.m. may not be returned until the following business day.  We are closed weekends and major holidays. You have access to a nurse at all times for urgent questions. Please call the main number to the clinic [phone] and follow the prompts.  For any non-urgent questions, you may also contact your provider using MyChart. We now offer e-Visits for anyone 18 and older to request care online for non-urgent symptoms. For details visit [email or web address].   Also download the MyChart app! Go to the app store, search "MyChart", open the app, select [place], and log in with your MyChart username and password.    

## 2023-05-01 NOTE — Progress Notes (Signed)
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  INTERVAL HISTORY: Patient returns to clinic today for further evaluation and consideration of cycle 2 of weekly cisplatin.  He initiated XRT earlier this week.  He tolerated his first treatment well only with some mild fatigue.  He otherwise feels well and is asymptomatic.  He denies any dysphagia.  He has no neurologic complaints.  He denies any recent fevers or illnesses.  He has a good appetite and denies weight loss.  He has no chest pain, shortness of breath, cough, or hemoptysis.  He denies any nausea, vomiting, constipation, or diarrhea.  He has no urinary complaints.  Patient offers no further specific complaints today.  REVIEW OF SYSTEMS:   Review of Systems  Constitutional: Negative.  Negative for fever, malaise/fatigue and weight loss.  Respiratory: Negative.  Negative for cough, hemoptysis and shortness of breath.   Cardiovascular: Negative.  Negative for chest pain and leg swelling.  Gastrointestinal: Negative.  Negative for abdominal pain.  Genitourinary: Negative.  Negative for dysuria.  Musculoskeletal: Negative.  Negative for back pain.  Skin: Negative.  Negative for rash.  Neurological: Negative.  Negative for dizziness, focal weakness, weakness and headaches.  Psychiatric/Behavioral: Negative.  The patient is not nervous/anxious.     As per HPI. Otherwise, a complete review of systems is negative.  PAST MEDICAL HISTORY: Past Medical History:  Diagnosis Date   GERD (gastroesophageal reflux disease)    Gout    Hypertension    PVC (premature ventricular contraction)    Sleep apnea    Squamous cell carcinoma of face     PAST SURGICAL HISTORY: Past Surgical History:   Procedure Laterality Date   KNEE SURGERY  1974   PARATHYROIDECTOMY Left 03/29/2020   Procedure: PARATHYROIDECTOMY;  Surgeon: Linus Salmons, MD;  Location: [facility] ORS;  Service: ENT;  Laterality: Left;    FAMILY HISTORY: Family History  Problem Relation Age of Onset   Hypertension Mother    Cancer Mother        skin cancer   Hypertension Father    Cancer Father        skin   Cancer Sister        skin cancer    ADVANCED DIRECTIVES (Y/N):  N  HEALTH MAINTENANCE: Social History   Tobacco Use   Smoking status: Never   Smokeless tobacco: Never  Substance Use Topics   Alcohol use: Yes    Comment: occasional   Drug use: Never     Colonoscopy:  PAP:  Bone density:  Lipid panel:  No Known Allergies  Current Outpatient Medications  Medication Sig Dispense Refill   acetaminophen (TYLENOL) 500 MG tablet Take 1,000 mg by mouth every 6 (six) hours as needed for moderate pain or headache.     allopurinol (ZYLOPRIM) 100 MG tablet Take 100 mg by mouth daily.     Carboxymethylcellulose Sodium (ARTIFICIAL TEARS OP) Place 1 drop into both eyes daily as needed (dry eyes).     carvedilol (COREG) 25 MG tablet Take 25 mg by mouth 2 (two) times daily.     chlorthalidone (HYGROTON) 25 MG tablet Take 25 mg by mouth every morning.     colchicine 0.6 MG tablet Take 0.6 mg by mouth 2 (two) times daily as needed (  gout flare).      LORazepam (ATIVAN) 0.5 MG tablet Take 0.5 mg by mouth at bedtime as needed for sleep.     losartan (COZAAR) 100 MG tablet Take 100 mg by mouth daily.     Multiple Vitamin (MULTIVITAMIN WITH MINERALS) TABS tablet Take 1 tablet by mouth daily.     naproxen sodium (ALEVE) 220 MG tablet Take 220 mg by mouth 2 (two) times daily as needed (pain).     niacinamide 100 MG tablet Take 100 mg by mouth 2 (two) times daily with a meal.     omeprazole (PRILOSEC) 20 MG capsule Take 20 mg by mouth daily.     ondansetron (ZOFRAN) 8 MG tablet Take 1 tablet (8 mg total) by mouth every  8 (eight) hours as needed for nausea or vomiting. Start on the third day after cisplatin. 30 tablet 1   potassium chloride SA (KLOR-CON M) 20 MEQ tablet Take 1 tablet (20 mEq total) by mouth 2 (two) times daily. 60 tablet 1   prednisoLONE acetate (PRED FORTE) 1 % ophthalmic suspension Place 1 drop into both eyes daily as needed (allergies).     prochlorperazine (COMPAZINE) 10 MG tablet Take 1 tablet (10 mg total) by mouth every 6 (six) hours as needed (Nausea or vomiting). 30 tablet 1   ofloxacin (OCUFLOX) 0.3 % ophthalmic solution Place 2 drops into both eyes daily as needed (infection).  (Patient not taking: Reported on 03/26/2023)     No current facility-administered medications for this visit.    OBJECTIVE: Vitals:   05/01/23 0829  BP: (!) 169/96  Pulse: 65  Resp: 16  Temp: 97.7 F (36.5 C)  SpO2: 97%     Body mass index is 40.01 kg/m.    ECOG FS:0 - Asymptomatic  General: Well-developed, well-nourished, no acute distress. Eyes: Pink conjunctiva, anicteric sclera. HEENT: Normocephalic, moist mucous membranes.  Easily palpable left neck mass. Lungs: No audible wheezing or coughing. Heart: Regular rate and rhythm. Abdomen: Soft, nontender, no obvious distention. Musculoskeletal: No edema, cyanosis, or clubbing. Neuro: Alert, answering all questions appropriately. Cranial nerves grossly intact. Skin: No rashes or petechiae noted. Psych: Normal affect.  LAB RESULTS:  Lab Results  Component Value Date   NA 136 05/01/2023   K 3.4 (L) 05/01/2023   CL 96 (L) 05/01/2023   CO2 24 05/01/2023   GLUCOSE 181 (H) 05/01/2023   BUN 21 05/01/2023   CREATININE 1.27 (H) 05/01/2023   CALCIUM 9.1 05/01/2023   GFRNONAA >60 05/01/2023   GFRAA >60 03/29/2020    Lab Results  Component Value Date   WBC 10.3 05/01/2023   NEUTROABS 7.6 05/01/2023   HGB 15.3 05/01/2023   HCT 44.3 05/01/2023   MCV 93.3 05/01/2023   PLT 292 05/01/2023     STUDIES: MR Brain W Wo Contrast  Result Date:  04/08/2023 CLINICAL DATA:  Staging for squamous cell carcinoma of the parotid gland. EXAM: MRI HEAD WITHOUT AND WITH CONTRAST TECHNIQUE: Multiplanar, multiecho pulse sequences of the brain and surrounding structures were obtained without and with intravenous contrast. CONTRAST:  10mL GADAVIST GADOBUTROL 1 MMOL/ML IV SOLN COMPARISON:  Neck CT 03/07/2023. FINDINGS: Brain: No acute infarct or hemorrhage. No hydrocephalus or extra-axial collection. No foci of abnormal susceptibility. No mass or abnormal enhancement within the brain or along the course of the left facial nerve. Vascular: Normal flow voids and enhancement. Skull and upper cervical spine: Normal marrow signal and enhancement. Sinuses/Orbits: Unremarkable. Other: Partially visualized heterogeneously enhancing lesion in the inferior  aspect of the left parotid gland, consistent with known squamous cell carcinoma. IMPRESSION: 1. No evidence of intracranial metastatic disease or perineural spread along the left facial nerve. 2. Partially visualized heterogeneously enhancing lesion in the inferior aspect of the left parotid gland, consistent with known squamous cell carcinoma. Electronically Signed   By: Orvan Falconer M.D.   On: 04/08/2023 14:05   NM PET Image Restag (PS) Skull Base To Thigh  Result Date: 04/08/2023 CLINICAL DATA:  Initial treatment strategy for gross cell carcinoma of the parotid gland. EXAM: NUCLEAR MEDICINE PET SKULL BASE TO THIGH TECHNIQUE: 13.0 mCi F-18 FDG was injected intravenously. Full-ring PET imaging was performed from the skull base to thigh after the radiotracer. CT data was obtained and used for attenuation correction and anatomic localization. Fasting blood glucose: 121 mg/dl COMPARISON:  CT neck 40/98/1191. FINDINGS: Mediastinal blood pool activity: SUV max 2.9 Liver activity: SUV max NA NECK: Hypermetabolic 1.3 x 1.9 cm left parotid nodule (4/27), SUV max 15.8. Right parotid nodule measures 7 x 8 mm (4/24), SUV max 2.7.  Metabolic bilateral level II/III and V lymph nodes. Hypermetabolic left level V lymph node measures 11 mm (4/31), SUV max 4.4. Symmetric bilateral tonsillar uptake. Incidental CT findings: None. CHEST: No abnormal hypermetabolism. Incidental CT findings: Heart is enlarged. No pericardial or pleural effusion. Moderate hiatal hernia. ABDOMEN/PELVIS: No abnormal hypermetabolism. Incidental CT findings: Liver, gallbladder and adrenal glands are unremarkable. Tiny stone in the right kidney. Low-attenuation lesions in the kidneys. No specific follow-up necessary. Spleen, pancreas, stomach and bowel are grossly unremarkable. 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 SKELETON: No abnormal hypermetabolism. Incidental CT findings: Degenerative changes in the spine. IMPRESSION: 1. Hypermetabolic left parotid nodule with hypermetabolic bilateral level II, III and V lymph nodes. No evidence of distant metastatic disease. 2. Small nodule in the right parotid gland shows metabolism in the region of blood pool, worrisome for malignancy. 3. No evidence of distant metastatic disease. 4. Moderate hiatal hernia. 5. Tiny right renal stone. 6. Distal superior mesenteric artery aneurysm. Electronically Signed   By: Leanna Battles M.D.   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  Biopsy confirmed squamous cell carcinoma.  PET scan results from April 04, 2023 reviewed independently with hypermetabolism in left parotid gland as well as several cervical lymph nodes.  Patient will benefit from concurrent XRT along with weekly cisplatin.  Proceed with cycle 2 of weekly cisplatin today.  Patient has declined port placement.  Continue daily XRT.  Return to clinic in 1 week for further  evaluation and consideration of cycle 3. Hypokalemia: Mildly improved.  Patient's potassium is 3.4.  Continue oral potassium twice per day.  He has also been given dietary instructions.  Patient will receive 20 mEq IV potassium today as well. Hypomagnesia: Magnesium today is 1.4.  Continue 4 g IV magnesium along with his treatments. Renal insufficiency: Chronic and unchanged.  Patient's creatinine is 1.27.  Monitor closely while giving cisplatin.   Patient expressed understanding and was in agreement with this plan. He also understands that He can call clinic at any time with any questions, concerns, or complaints.    Cancer Staging  Primary squamous cell carcinoma of parotid gland [facility]) Staging form: Major Salivary  Glands, AJCC 8th Edition - Clinical stage from 04/15/2023: Stage IVA (cT2, cN2b, cM0) - Signed by Jeralyn Ruths, MD on 04/15/2023   Jeralyn Ruths, MD   05/01/2023 8:41 AM

## 2023-05-02 ENCOUNTER — Ambulatory Visit
Admission: RE | Admit: 2023-05-02 | Discharge: 2023-05-02 | Disposition: A | Discharge status: Home / Self Care | Payer: Medicare PPO | Source: Ambulatory Visit | Attending: Radiation Oncology | Admitting: Radiation Oncology

## 2023-05-02 ENCOUNTER — Other Ambulatory Visit: Payer: Self-pay

## 2023-05-02 DIAGNOSIS — Z510 Encounter for antineoplastic radiation therapy: Secondary | ICD-10-CM | POA: Diagnosis not present

## 2023-05-02 LAB — RAD ONC ARIA SESSION SUMMARY
Course Elapsed Days: 2
Plan Fractions Treated to Date: 3
Plan Prescribed Dose Per Fraction: 2 Gy
Plan Total Fractions Prescribed: 33
Plan Total Prescribed Dose: 66 Gy
Reference Point Dosage Given to Date: 6 Gy
Reference Point Session Dosage Given: 2 Gy
Session Number: 3

## 2023-05-03 ENCOUNTER — Ambulatory Visit
Admission: RE | Admit: 2023-05-03 | Discharge: 2023-05-03 | Disposition: A | Discharge status: Home / Self Care | Payer: Medicare PPO | Source: Ambulatory Visit | Attending: Radiation Oncology | Admitting: Radiation Oncology

## 2023-05-03 ENCOUNTER — Other Ambulatory Visit: Payer: Self-pay

## 2023-05-03 DIAGNOSIS — Z510 Encounter for antineoplastic radiation therapy: Secondary | ICD-10-CM | POA: Diagnosis not present

## 2023-05-03 LAB — RAD ONC ARIA SESSION SUMMARY
Course Elapsed Days: 3
Plan Fractions Treated to Date: 4
Plan Prescribed Dose Per Fraction: 2 Gy
Plan Total Fractions Prescribed: 33
Plan Total Prescribed Dose: 66 Gy
Reference Point Dosage Given to Date: 8 Gy
Reference Point Session Dosage Given: 2 Gy
Session Number: 4

## 2023-05-06 ENCOUNTER — Ambulatory Visit
Admission: RE | Admit: 2023-05-06 | Discharge: 2023-05-06 | Disposition: A | Discharge status: Home / Self Care | Payer: Medicare PPO | Source: Ambulatory Visit | Attending: Radiation Oncology | Admitting: Radiation Oncology

## 2023-05-06 ENCOUNTER — Other Ambulatory Visit: Payer: Self-pay

## 2023-05-06 DIAGNOSIS — Z510 Encounter for antineoplastic radiation therapy: Secondary | ICD-10-CM | POA: Diagnosis not present

## 2023-05-06 LAB — RAD ONC ARIA SESSION SUMMARY
Course Elapsed Days: 6
Plan Fractions Treated to Date: 5
Plan Prescribed Dose Per Fraction: 2 Gy
Plan Total Fractions Prescribed: 33
Plan Total Prescribed Dose: 66 Gy
Reference Point Dosage Given to Date: 10 Gy
Reference Point Session Dosage Given: 2 Gy
Session Number: 5

## 2023-05-07 ENCOUNTER — Other Ambulatory Visit: Payer: Self-pay

## 2023-05-07 ENCOUNTER — Ambulatory Visit
Admission: RE | Admit: 2023-05-07 | Discharge: 2023-05-07 | Disposition: A | Discharge status: Home / Self Care | Payer: Medicare PPO | Source: Ambulatory Visit | Attending: Radiation Oncology | Admitting: Radiation Oncology

## 2023-05-07 DIAGNOSIS — Z510 Encounter for antineoplastic radiation therapy: Secondary | ICD-10-CM | POA: Diagnosis not present

## 2023-05-07 LAB — RAD ONC ARIA SESSION SUMMARY
Course Elapsed Days: 7
Plan Fractions Treated to Date: 6
Plan Prescribed Dose Per Fraction: 2 Gy
Plan Total Fractions Prescribed: 33
Plan Total Prescribed Dose: 66 Gy
Reference Point Dosage Given to Date: 12 Gy
Reference Point Session Dosage Given: 2 Gy
Session Number: 6

## 2023-05-07 MED FILL — Fosaprepitant Dimeglumine For IV Infusion 150 MG (Base Eq): INTRAVENOUS | Qty: 5 | Status: AC

## 2023-05-07 MED FILL — Dexamethasone Sodium Phosphate Inj 100 MG/10ML: INTRAMUSCULAR | Qty: 1 | Status: AC

## 2023-05-08 ENCOUNTER — Inpatient Hospital Stay: Payer: Medicare PPO

## 2023-05-08 ENCOUNTER — Inpatient Hospital Stay: Payer: Medicare PPO | Admitting: Oncology

## 2023-05-08 ENCOUNTER — Ambulatory Visit
Admission: RE | Admit: 2023-05-08 | Discharge: 2023-05-08 | Disposition: A | Discharge status: Home / Self Care | Payer: Medicare PPO | Source: Ambulatory Visit | Attending: Radiation Oncology | Admitting: Radiation Oncology

## 2023-05-08 ENCOUNTER — Encounter: Payer: Self-pay | Admitting: Oncology

## 2023-05-08 ENCOUNTER — Other Ambulatory Visit: Payer: Self-pay

## 2023-05-08 DIAGNOSIS — C07 Malignant neoplasm of parotid gland: Secondary | ICD-10-CM | POA: Diagnosis not present

## 2023-05-08 DIAGNOSIS — Z510 Encounter for antineoplastic radiation therapy: Secondary | ICD-10-CM | POA: Diagnosis not present

## 2023-05-08 LAB — CBC WITH DIFFERENTIAL (CANCER CENTER ONLY)
Abs Immature Granulocytes: 0.03 10*3/uL (ref 0.00–0.07)
Basophils Absolute: 0 10*3/uL (ref 0.0–0.1)
Basophils Relative: 1 %
Eosinophils Absolute: 0 10*3/uL (ref 0.0–0.5)
Eosinophils Relative: 1 %
HCT: 41.3 % (ref 39.0–52.0)
Hemoglobin: 14.5 g/dL (ref 13.0–17.0)
Immature Granulocytes: 0 %
Lymphocytes Relative: 14 %
Lymphs Abs: 1 10*3/uL (ref 0.7–4.0)
MCH: 32.7 pg (ref 26.0–34.0)
MCHC: 35.1 g/dL (ref 30.0–36.0)
MCV: 93.2 fL (ref 80.0–100.0)
Monocytes Absolute: 0.6 10*3/uL (ref 0.1–1.0)
Monocytes Relative: 9 %
Neutro Abs: 5.3 10*3/uL (ref 1.7–7.7)
Neutrophils Relative %: 75 %
Platelet Count: 213 10*3/uL (ref 150–400)
RBC: 4.43 MIL/uL (ref 4.22–5.81)
RDW: 12.5 % (ref 11.5–15.5)
WBC Count: 7 10*3/uL (ref 4.0–10.5)
nRBC: 0 % (ref 0.0–0.2)

## 2023-05-08 LAB — RAD ONC ARIA SESSION SUMMARY
Course Elapsed Days: 8
Plan Fractions Treated to Date: 7
Plan Prescribed Dose Per Fraction: 2 Gy
Plan Total Fractions Prescribed: 33
Plan Total Prescribed Dose: 66 Gy
Reference Point Dosage Given to Date: 14 Gy
Reference Point Session Dosage Given: 2 Gy
Session Number: 7

## 2023-05-08 LAB — BASIC METABOLIC PANEL - CANCER CENTER ONLY
Anion gap: 11 (ref 5–15)
BUN: 19 mg/dL (ref 8–23)
CO2: 25 mmol/L (ref 22–32)
Calcium: 8.4 mg/dL — ABNORMAL LOW (ref 8.9–10.3)
Chloride: 98 mmol/L (ref 98–111)
Creatinine: 1.19 mg/dL (ref 0.61–1.24)
GFR, Estimated: >60 mL/min (ref >60)
Glucose, Bld: 167 mg/dL — ABNORMAL HIGH (ref 70–99)
Potassium: 3.5 mmol/L (ref 3.5–5.1)
Sodium: 134 mmol/L — ABNORMAL LOW (ref 135–145)

## 2023-05-08 LAB — MAGNESIUM: Magnesium: 1.6 mg/dL — ABNORMAL LOW (ref 1.7–2.4)

## 2023-05-08 MED ORDER — SODIUM CHLORIDE 0.9 % IV SOLN
Freq: Once | INTRAVENOUS | Status: AC
  Filled 2023-05-08: qty 250

## 2023-05-08 MED ORDER — HEPARIN SOD (PORK) LOCK FLUSH 100 UNIT/ML IV SOLN
500.0000 [IU] | Freq: Once | INTRAVENOUS | Status: DC | PRN
  Filled 2023-05-08: qty 5

## 2023-05-08 MED ORDER — MAGNESIUM SULFATE 4 GM/100ML IV SOLN
4.0000 g | Freq: Once | INTRAVENOUS | Status: AC
  Administered 2023-05-08: 4 g via INTRAVENOUS
  Filled 2023-05-08: qty 100

## 2023-05-08 MED ORDER — SODIUM CHLORIDE 0.9 % IV SOLN
10.0000 mg | Freq: Once | INTRAVENOUS | Status: AC
  Administered 2023-05-08: 10 mg via INTRAVENOUS
  Filled 2023-05-08: qty 10

## 2023-05-08 MED ORDER — SODIUM CHLORIDE 0.9 % IV SOLN
40.0000 mg/m2 | Freq: Once | INTRAVENOUS | Status: AC
  Administered 2023-05-08: 100 mg via INTRAVENOUS
  Filled 2023-05-08: qty 100

## 2023-05-08 MED ORDER — PALONOSETRON HCL INJECTION 0.25 MG/5ML
0.2500 mg | Freq: Once | INTRAVENOUS | Status: AC
  Administered 2023-05-08: 0.25 mg via INTRAVENOUS
  Filled 2023-05-08: qty 5

## 2023-05-08 MED ORDER — POTASSIUM CHLORIDE IN NACL 20-0.9 MEQ/L-% IV SOLN
Freq: Once | INTRAVENOUS | Status: AC
  Filled 2023-05-08: qty 1000

## 2023-05-08 MED ORDER — SODIUM CHLORIDE 0.9 % IV SOLN
150.0000 mg | Freq: Once | INTRAVENOUS | Status: AC
  Administered 2023-05-08: 150 mg via INTRAVENOUS
  Filled 2023-05-08: qty 150

## 2023-05-08 NOTE — Patient Instructions (Signed)
[facility]  Discharge Instructions: Thank you for choosing [facility] to provide your oncology and hematology care.  If you have a lab appointment with the Cancer Center, please go directly to the Cancer Center and check in at the registration area.  Wear comfortable clothing and clothing appropriate for easy access to any Portacath or PICC line.   We strive to give you quality time with your provider. You may need to reschedule your appointment if you arrive late (15 or more minutes).  Arriving late affects you and other patients whose appointments are after yours.  Also, if you miss three or more appointments without notifying the office, you may be dismissed from the clinic at the provider's discretion.      For prescription refill requests, have your pharmacy contact our office and allow 72 hours for refills to be completed.    Today you received the following chemotherapy and/or immunotherapy agents Cisplatin      To help prevent nausea and vomiting after your treatment, we encourage you to take your nausea medication as directed.  BELOW ARE SYMPTOMS THAT SHOULD BE REPORTED IMMEDIATELY: *FEVER GREATER THAN 100.4 F (38 C) OR HIGHER *CHILLS OR SWEATING *NAUSEA AND VOMITING THAT IS NOT CONTROLLED WITH YOUR NAUSEA MEDICATION *UNUSUAL SHORTNESS OF BREATH *UNUSUAL BRUISING OR BLEEDING *URINARY PROBLEMS (pain or burning when urinating, or frequent urination) *BOWEL PROBLEMS (unusual diarrhea, constipation, pain near the anus) TENDERNESS IN MOUTH AND THROAT WITH OR WITHOUT PRESENCE OF ULCERS (sore throat, sores in mouth, or a toothache) UNUSUAL RASH, SWELLING OR PAIN  UNUSUAL VAGINAL DISCHARGE OR ITCHING   Items with * indicate a potential emergency and should be followed up as soon as possible or go to the Emergency Department if any problems should occur.  Please show the CHEMOTHERAPY ALERT CARD or IMMUNOTHERAPY ALERT CARD at check-in to  the Emergency Department and triage nurse.  Should you have questions after your visit or need to cancel or reschedule your appointment, please contact [facility] [place] [facility]  [phone] and follow the prompts.  Office hours are 8:00 a.m. to 4:30 p.m. Monday - Friday. Please note that voicemails left after 4:00 p.m. may not be returned until the following business day.  We are closed weekends and major holidays. You have access to a nurse at all times for urgent questions. Please call the main number to the clinic [phone] and follow the prompts.  For any non-urgent questions, you may also contact your provider using MyChart. We now offer e-Visits for anyone 18 and older to request care online for non-urgent symptoms. For details visit [email or web address].   Also download the MyChart app! Go to the app store, search "MyChart", open the app, select [place], and log in with your MyChart username and password.    

## 2023-05-08 NOTE — Progress Notes (Signed)
Per Dr. Orlie Dakin, ok to run post hydration fluids concurrently with Cisplatin

## 2023-05-08 NOTE — Progress Notes (Signed)
Nutrition Follow-up:   Patient with stage IV SCC of parotid gland.  Receiving cisplatin and radiation.   Met with patient during infusion.  Reports that a couple of days after treatment fatigue and decreased appetite occurred for few days then appetite increased.  Reports dry mouth.      Medications: reviewed  Labs: Na 134, glucose 167, Mag 1.6  Anthropometrics:   Weight 294 lb today 296 lb on 5/8 292 lb on 08/27/22   NUTRITION DIAGNOSIS: Predicted sub optimal energy intake ongoing   INTERVENTION:  Discussed strategies to help with dry mouth.  Handout provided    MONITORING, EVALUATION, GOAL: weight trends, intake   NEXT VISIT: Wednesday, June 5th during infusion  Zaila Crew B. Freida Busman, RD, LDN Registered Dietitian [phone]

## 2023-05-08 NOTE — Progress Notes (Signed)
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  INTERVAL HISTORY: Patient returns to clinic today for further evaluation and consideration of cycle 3 of weekly cisplatin.  He has slight dry mouth, but otherwise is tolerating his treatments well.  He denies any dysphagia.  He has no neurologic complaints.  He denies any recent fevers or illnesses.  He has a good appetite and denies weight loss.  He has no chest pain, shortness of breath, cough, or hemoptysis.  He denies any nausea, vomiting, constipation, or diarrhea.  He has no urinary complaints.  Patient offers no further specific complaints today.  REVIEW OF SYSTEMS:   Review of Systems  Constitutional: Negative.  Negative for fever, malaise/fatigue and weight loss.  Respiratory: Negative.  Negative for cough, hemoptysis and shortness of breath.   Cardiovascular: Negative.  Negative for chest pain and leg swelling.  Gastrointestinal: Negative.  Negative for abdominal pain.  Genitourinary: Negative.  Negative for dysuria.  Musculoskeletal: Negative.  Negative for back pain.  Skin: Negative.  Negative for rash.  Neurological: Negative.  Negative for dizziness, focal weakness, weakness and headaches.  Psychiatric/Behavioral: Negative.  The patient is not nervous/anxious.     As per HPI. Otherwise, a complete review of systems is negative.  PAST MEDICAL HISTORY: Past Medical History:  Diagnosis Date   GERD (gastroesophageal reflux disease)    Gout    Hypertension    PVC (premature ventricular contraction)    Sleep apnea    Squamous cell carcinoma of face     PAST SURGICAL HISTORY: Past Surgical History:  Procedure Laterality Date   KNEE SURGERY  1974   PARATHYROIDECTOMY Left  03/29/2020   Procedure: PARATHYROIDECTOMY;  Surgeon: Linus Salmons, MD;  Location: [facility] ORS;  Service: ENT;  Laterality: Left;    FAMILY HISTORY: Family History  Problem Relation Age of Onset   Hypertension Mother    Cancer Mother        skin cancer   Hypertension Father    Cancer Father        skin   Cancer Sister        skin cancer    ADVANCED DIRECTIVES (Y/N):  N  HEALTH MAINTENANCE: Social History   Tobacco Use   Smoking status: Never   Smokeless tobacco: Never  Substance Use Topics   Alcohol use: Yes    Comment: occasional   Drug use: Never     Colonoscopy:  PAP:  Bone density:  Lipid panel:  No Known Allergies  Current Outpatient Medications  Medication Sig Dispense Refill   acetaminophen (TYLENOL) 500 MG tablet Take 1,000 mg by mouth every 6 (six) hours as needed for moderate pain or headache.     allopurinol (ZYLOPRIM) 100 MG tablet Take 100 mg by mouth daily.     Carboxymethylcellulose Sodium (ARTIFICIAL TEARS OP) Place 1 drop into both eyes daily as needed (dry eyes).     carvedilol (COREG) 25 MG tablet Take 25 mg by mouth 2 (two) times daily.     chlorthalidone (HYGROTON) 25 MG tablet Take 25 mg by mouth every morning.     colchicine 0.6 MG tablet Take 0.6 mg by mouth 2 (two) times daily as needed (gout flare).      LORazepam (ATIVAN) 0.5 MG tablet Take 0.5  mg by mouth at bedtime as needed for sleep.     losartan (COZAAR) 100 MG tablet Take 100 mg by mouth daily.     Multiple Vitamin (MULTIVITAMIN WITH MINERALS) TABS tablet Take 1 tablet by mouth daily.     naproxen sodium (ALEVE) 220 MG tablet Take 220 mg by mouth 2 (two) times daily as needed (pain).     niacinamide 100 MG tablet Take 100 mg by mouth 2 (two) times daily with a meal.     omeprazole (PRILOSEC) 20 MG capsule Take 20 mg by mouth daily.     ondansetron (ZOFRAN) 8 MG tablet Take 1 tablet (8 mg total) by mouth every 8 (eight) hours as needed for nausea or vomiting. Start on the third day  after cisplatin. 30 tablet 1   potassium chloride SA (KLOR-CON M) 20 MEQ tablet Take 1 tablet (20 mEq total) by mouth 2 (two) times daily. 60 tablet 1   prednisoLONE acetate (PRED FORTE) 1 % ophthalmic suspension Place 1 drop into both eyes daily as needed (allergies).     prochlorperazine (COMPAZINE) 10 MG tablet Take 1 tablet (10 mg total) by mouth every 6 (six) hours as needed (Nausea or vomiting). 30 tablet 1   ofloxacin (OCUFLOX) 0.3 % ophthalmic solution Place 2 drops into both eyes daily as needed (infection).  (Patient not taking: Reported on 03/26/2023)     No current facility-administered medications for this visit.    OBJECTIVE: There were no vitals filed for this visit.    There is no height or weight on file to calculate BMI.    ECOG FS:0 - Asymptomatic  General: Well-developed, well-nourished, no acute distress. Eyes: Pink conjunctiva, anicteric sclera. HEENT: Normocephalic, moist mucous membranes.  Left neck mass decreased in size. Lungs: No audible wheezing or coughing. Heart: Regular rate and rhythm. Abdomen: Soft, nontender, no obvious distention. Musculoskeletal: No edema, cyanosis, or clubbing. Neuro: Alert, answering all questions appropriately. Cranial nerves grossly intact. Skin: No rashes or petechiae noted. Psych: Normal affect.   LAB RESULTS:  Lab Results  Component Value Date   NA 134 (L) 05/08/2023   K 3.5 05/08/2023   CL 98 05/08/2023   CO2 25 05/08/2023   GLUCOSE 167 (H) 05/08/2023   BUN 19 05/08/2023   CREATININE 1.19 05/08/2023   CALCIUM 8.4 (L) 05/08/2023   GFRNONAA >60 05/08/2023   GFRAA >60 03/29/2020    Lab Results  Component Value Date   WBC 7.0 05/08/2023   NEUTROABS 5.3 05/08/2023   HGB 14.5 05/08/2023   HCT 41.3 05/08/2023   MCV 93.2 05/08/2023   PLT 213 05/08/2023     STUDIES: No results found.  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  PLAN:    Stage IVa squamous cell carcinoma of the parotid  gland: CT scan results from March 11, 2023 reviewed independently with a 2.5 cm nodule within the parotid gland.  Biopsy confirmed squamous cell carcinoma.  PET scan results from April 04, 2023 reviewed independently with hypermetabolism in left parotid gland as well as several cervical lymph nodes.  Patient will benefit from concurrent XRT along with weekly cisplatin.  Proceed with cycle 3 of weekly cisplatin today.  Patient has declined port placement.  Continue daily XRT.  Return to clinic in 1 week for further evaluation and consideration of cycle 4.   Hypokalemia: Resolved.  Continue oral potassium twice per day.  He has also been given dietary instructions.  Patient will receive 20 mEq IV  potassium today as well. Hypomagnesia: Magnesium mildly improved to 1.6.  Continue 4 g IV magnesium along with his treatments. Renal insufficiency: Resolved.  Monitor closely while giving cisplatin.   Patient expressed understanding and was in agreement with this plan. He also understands that He can call clinic at any time with any questions, concerns, or complaints.    Cancer Staging  Primary squamous cell carcinoma of parotid gland [facility]) Staging form: Major Salivary Glands, AJCC 8th Edition - Clinical stage from 04/15/2023: Stage IVA (cT2, cN2b, cM0) - Signed by Jeralyn Ruths, MD on 04/15/2023   Jeralyn Ruths, MD   05/08/2023 8:50 AM

## 2023-05-09 ENCOUNTER — Other Ambulatory Visit: Payer: Self-pay

## 2023-05-09 ENCOUNTER — Ambulatory Visit
Admission: RE | Admit: 2023-05-09 | Discharge: 2023-05-09 | Disposition: A | Discharge status: Home / Self Care | Payer: Medicare PPO | Source: Ambulatory Visit | Attending: Radiation Oncology | Admitting: Radiation Oncology

## 2023-05-09 DIAGNOSIS — Z510 Encounter for antineoplastic radiation therapy: Secondary | ICD-10-CM | POA: Diagnosis not present

## 2023-05-09 LAB — RAD ONC ARIA SESSION SUMMARY
Course Elapsed Days: 9
Plan Fractions Treated to Date: 8
Plan Prescribed Dose Per Fraction: 2 Gy
Plan Total Fractions Prescribed: 33
Plan Total Prescribed Dose: 66 Gy
Reference Point Dosage Given to Date: 16 Gy
Reference Point Session Dosage Given: 2 Gy
Session Number: 8

## 2023-05-10 ENCOUNTER — Ambulatory Visit
Admission: RE | Admit: 2023-05-10 | Discharge: 2023-05-10 | Disposition: A | Discharge status: Home / Self Care | Payer: Medicare PPO | Source: Ambulatory Visit | Attending: Radiation Oncology | Admitting: Radiation Oncology

## 2023-05-10 ENCOUNTER — Other Ambulatory Visit: Payer: Self-pay

## 2023-05-10 DIAGNOSIS — Z510 Encounter for antineoplastic radiation therapy: Secondary | ICD-10-CM | POA: Diagnosis not present

## 2023-05-10 LAB — RAD ONC ARIA SESSION SUMMARY
Course Elapsed Days: 10
Plan Fractions Treated to Date: 9
Plan Prescribed Dose Per Fraction: 2 Gy
Plan Total Fractions Prescribed: 33
Plan Total Prescribed Dose: 66 Gy
Reference Point Dosage Given to Date: 18 Gy
Reference Point Session Dosage Given: 2 Gy
Session Number: 9

## 2023-05-14 ENCOUNTER — Other Ambulatory Visit: Payer: Self-pay

## 2023-05-14 ENCOUNTER — Ambulatory Visit
Admission: RE | Admit: 2023-05-14 | Discharge: 2023-05-14 | Disposition: A | Discharge status: Home / Self Care | Payer: Medicare PPO | Source: Ambulatory Visit | Attending: Radiation Oncology | Admitting: Radiation Oncology

## 2023-05-14 DIAGNOSIS — Z510 Encounter for antineoplastic radiation therapy: Secondary | ICD-10-CM | POA: Diagnosis not present

## 2023-05-14 LAB — RAD ONC ARIA SESSION SUMMARY
Course Elapsed Days: 14
Plan Fractions Treated to Date: 10
Plan Prescribed Dose Per Fraction: 2 Gy
Plan Total Fractions Prescribed: 33
Plan Total Prescribed Dose: 66 Gy
Reference Point Dosage Given to Date: 20 Gy
Reference Point Session Dosage Given: 2 Gy
Session Number: 10

## 2023-05-14 MED FILL — Fosaprepitant Dimeglumine For IV Infusion 150 MG (Base Eq): INTRAVENOUS | Qty: 5 | Status: AC

## 2023-05-14 MED FILL — Dexamethasone Sodium Phosphate Inj 100 MG/10ML: INTRAMUSCULAR | Qty: 1 | Status: AC

## 2023-05-15 ENCOUNTER — Encounter: Payer: Self-pay | Admitting: Oncology

## 2023-05-15 ENCOUNTER — Inpatient Hospital Stay: Payer: Medicare PPO

## 2023-05-15 ENCOUNTER — Other Ambulatory Visit: Payer: Self-pay | Admitting: Oncology

## 2023-05-15 ENCOUNTER — Ambulatory Visit
Admission: RE | Admit: 2023-05-15 | Discharge: 2023-05-15 | Disposition: A | Discharge status: Home / Self Care | Payer: Medicare PPO | Source: Ambulatory Visit | Attending: Radiation Oncology | Admitting: Radiation Oncology

## 2023-05-15 ENCOUNTER — Inpatient Hospital Stay (HOSPITAL_BASED_OUTPATIENT_CLINIC_OR_DEPARTMENT_OTHER): Payer: Medicare PPO | Admitting: Oncology

## 2023-05-15 ENCOUNTER — Other Ambulatory Visit: Payer: Self-pay

## 2023-05-15 VITALS — BP 171/101 | HR 74

## 2023-05-15 DIAGNOSIS — C07 Malignant neoplasm of parotid gland: Secondary | ICD-10-CM

## 2023-05-15 DIAGNOSIS — Z510 Encounter for antineoplastic radiation therapy: Secondary | ICD-10-CM | POA: Diagnosis not present

## 2023-05-15 LAB — CBC WITH DIFFERENTIAL (CANCER CENTER ONLY)
Abs Immature Granulocytes: 0.01 10*3/uL (ref 0.00–0.07)
Basophils Absolute: 0 10*3/uL (ref 0.0–0.1)
Basophils Relative: 1 %
Eosinophils Absolute: 0 10*3/uL (ref 0.0–0.5)
Eosinophils Relative: 0 %
HCT: 40 % (ref 39.0–52.0)
Hemoglobin: 14.1 g/dL (ref 13.0–17.0)
Immature Granulocytes: 0 %
Lymphocytes Relative: 15 %
Lymphs Abs: 0.7 10*3/uL (ref 0.7–4.0)
MCH: 32.6 pg (ref 26.0–34.0)
MCHC: 35.3 g/dL (ref 30.0–36.0)
MCV: 92.6 fL (ref 80.0–100.0)
Monocytes Absolute: 0.6 10*3/uL (ref 0.1–1.0)
Monocytes Relative: 11 %
Neutro Abs: 3.7 10*3/uL (ref 1.7–7.7)
Neutrophils Relative %: 73 %
Platelet Count: 218 10*3/uL (ref 150–400)
RBC: 4.32 MIL/uL (ref 4.22–5.81)
RDW: 12.5 % (ref 11.5–15.5)
WBC Count: 5 10*3/uL (ref 4.0–10.5)
nRBC: 0 % (ref 0.0–0.2)

## 2023-05-15 LAB — BASIC METABOLIC PANEL - CANCER CENTER ONLY
Anion gap: 12 (ref 5–15)
BUN: 19 mg/dL (ref 8–23)
CO2: 26 mmol/L (ref 22–32)
Calcium: 8.4 mg/dL — ABNORMAL LOW (ref 8.9–10.3)
Chloride: 96 mmol/L — ABNORMAL LOW (ref 98–111)
Creatinine: 1.15 mg/dL (ref 0.61–1.24)
GFR, Estimated: >60 mL/min (ref >60)
Glucose, Bld: 146 mg/dL — ABNORMAL HIGH (ref 70–99)
Potassium: 3.3 mmol/L — ABNORMAL LOW (ref 3.5–5.1)
Sodium: 134 mmol/L — ABNORMAL LOW (ref 135–145)

## 2023-05-15 LAB — RAD ONC ARIA SESSION SUMMARY
Course Elapsed Days: 15
Plan Fractions Treated to Date: 11
Plan Prescribed Dose Per Fraction: 2 Gy
Plan Total Fractions Prescribed: 33
Plan Total Prescribed Dose: 66 Gy
Reference Point Dosage Given to Date: 22 Gy
Reference Point Session Dosage Given: 2 Gy
Session Number: 11

## 2023-05-15 LAB — MAGNESIUM: Magnesium: 1.3 mg/dL — ABNORMAL LOW (ref 1.7–2.4)

## 2023-05-15 MED ORDER — SODIUM CHLORIDE 0.9 % IV SOLN
Freq: Once | INTRAVENOUS | Status: AC
  Filled 2023-05-15: qty 250

## 2023-05-15 MED ORDER — SODIUM CHLORIDE 0.9 % IV SOLN
150.0000 mg | Freq: Once | INTRAVENOUS | Status: AC
  Administered 2023-05-15: 150 mg via INTRAVENOUS
  Filled 2023-05-15: qty 150

## 2023-05-15 MED ORDER — SODIUM CHLORIDE 0.9 % IV SOLN
40.0000 mg/m2 | Freq: Once | INTRAVENOUS | Status: AC
  Administered 2023-05-15: 100 mg via INTRAVENOUS
  Filled 2023-05-15: qty 100

## 2023-05-15 MED ORDER — POTASSIUM CHLORIDE IN NACL 20-0.9 MEQ/L-% IV SOLN
Freq: Once | INTRAVENOUS | Status: AC
  Filled 2023-05-15: qty 1000

## 2023-05-15 MED ORDER — PALONOSETRON HCL INJECTION 0.25 MG/5ML
0.2500 mg | Freq: Once | INTRAVENOUS | Status: AC
  Administered 2023-05-15: 0.25 mg via INTRAVENOUS
  Filled 2023-05-15: qty 5

## 2023-05-15 MED ORDER — SODIUM CHLORIDE 0.9 % IV SOLN
10.0000 mg | Freq: Once | INTRAVENOUS | Status: AC
  Administered 2023-05-15: 10 mg via INTRAVENOUS
  Filled 2023-05-15: qty 10

## 2023-05-15 MED ORDER — MAGNESIUM SULFATE 4 GM/100ML IV SOLN
4.0000 g | Freq: Once | INTRAVENOUS | Status: AC
  Administered 2023-05-15: 4 g via INTRAVENOUS
  Filled 2023-05-15: qty 100

## 2023-05-15 NOTE — Patient Instructions (Signed)
[facility] [facility]  Discharge Instructions: Thank you for choosing [facility] to provide your oncology and hematology care.  If you have a lab appointment with the Cancer Center, please go directly to the Cancer Center and check in at the registration area.  Wear comfortable clothing and clothing appropriate for easy access to any Portacath or PICC line.   We strive to give you quality time with your provider. You may need to reschedule your appointment if you arrive late (15 or more minutes).  Arriving late affects you and other patients whose appointments are after yours.  Also, if you miss three or more appointments without notifying the office, you may be dismissed from the clinic at the provider's discretion.      For prescription refill requests, have your pharmacy contact our office and allow 72 hours for refills to be completed.    Today you received the following chemotherapy and/or immunotherapy agents Cisplatin      To help prevent nausea and vomiting after your treatment, we encourage you to take your nausea medication as directed.  BELOW ARE SYMPTOMS THAT SHOULD BE REPORTED IMMEDIATELY: *FEVER GREATER THAN 100.4 F (38 C) OR HIGHER *CHILLS OR SWEATING *NAUSEA AND VOMITING THAT IS NOT CONTROLLED WITH YOUR NAUSEA MEDICATION *UNUSUAL SHORTNESS OF BREATH *UNUSUAL BRUISING OR BLEEDING *URINARY PROBLEMS (pain or burning when urinating, or frequent urination) *BOWEL PROBLEMS (unusual diarrhea, constipation, pain near the anus) TENDERNESS IN MOUTH AND THROAT WITH OR WITHOUT PRESENCE OF ULCERS (sore throat, sores in mouth, or a toothache) UNUSUAL RASH, SWELLING OR PAIN  UNUSUAL VAGINAL DISCHARGE OR ITCHING   Items with * indicate a potential emergency and should be followed up as soon as possible or go to the Emergency Department if any problems should occur.  Please show the CHEMOTHERAPY ALERT CARD or IMMUNOTHERAPY ALERT CARD at check-in to  the Emergency Department and triage nurse.  Should you have questions after your visit or need to cancel or reschedule your appointment, please contact [facility]  [phone] and follow the prompts.  Office hours are 8:00 a.m. to 4:30 p.m. Monday - Friday. Please note that voicemails left after 4:00 p.m. may not be returned until the following business day.  We are closed weekends and major holidays. You have access to a nurse at all times for urgent questions. Please call the main number to the clinic [phone] and follow the prompts.  For any non-urgent questions, you may also contact your provider using MyChart. We now offer e-Visits for anyone 18 and older to request care online for non-urgent symptoms. For details visit [email or web address].   Also download the MyChart app! Go to the app store, search "MyChart", open the app, select [facility], and log in with your MyChart username and password.    

## 2023-05-15 NOTE — Progress Notes (Signed)
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  INTERVAL HISTORY: Patient returns to clinic today for further evaluation and consideration of cycle 4 of weekly cisplatin.  He continues to have dry mouth and altered taste, but otherwise is tolerating his treatments well.  He does not complain of any dysphagia. He has no neurologic complaints.  He denies any recent fevers or illnesses.  He has a good appetite and denies weight loss.  He has no chest pain, shortness of breath, cough, or hemoptysis.  He denies any nausea, vomiting, constipation, or diarrhea.  He has no urinary complaints.  Patient offers no further specific complaints today.  REVIEW OF SYSTEMS:   Review of Systems  Constitutional: Negative.  Negative for fever, malaise/fatigue and weight loss.  Respiratory: Negative.  Negative for cough, hemoptysis and shortness of breath.   Cardiovascular: Negative.  Negative for chest pain and leg swelling.  Gastrointestinal: Negative.  Negative for abdominal pain.  Genitourinary: Negative.  Negative for dysuria.  Musculoskeletal: Negative.  Negative for back pain.  Skin: Negative.  Negative for rash.  Neurological: Negative.  Negative for dizziness, focal weakness, weakness and headaches.  Psychiatric/Behavioral: Negative.  The patient is not nervous/anxious.     As per HPI. Otherwise, a complete review of systems is negative.  PAST MEDICAL HISTORY: Past Medical History:  Diagnosis Date   GERD (gastroesophageal reflux disease)    Gout    Hypertension    PVC (premature ventricular contraction)    Sleep apnea    Squamous cell carcinoma of face     PAST SURGICAL HISTORY: Past Surgical History:  Procedure Laterality Date   KNEE SURGERY   1974   PARATHYROIDECTOMY Left 03/29/2020   Procedure: PARATHYROIDECTOMY;  Surgeon: Linus Salmons, MD;  Location: [facility] ORS;  Service: ENT;  Laterality: Left;    FAMILY HISTORY: Family History  Problem Relation Age of Onset   Hypertension Mother    Cancer Mother        skin cancer   Hypertension Father    Cancer Father        skin   Cancer Sister        skin cancer    ADVANCED DIRECTIVES (Y/N):  N  HEALTH MAINTENANCE: Social History   Tobacco Use   Smoking status: Never   Smokeless tobacco: Never  Substance Use Topics   Alcohol use: Yes    Comment: occasional   Drug use: Never     Colonoscopy:  PAP:  Bone density:  Lipid panel:  No Known Allergies  Current Outpatient Medications  Medication Sig Dispense Refill   acetaminophen (TYLENOL) 500 MG tablet Take 1,000 mg by mouth every 6 (six) hours as needed for moderate pain or headache.     allopurinol (ZYLOPRIM) 100 MG tablet Take 100 mg by mouth daily.     Carboxymethylcellulose Sodium (ARTIFICIAL TEARS OP) Place 1 drop into both eyes daily as needed (dry eyes).     carvedilol (COREG) 25 MG tablet Take 25 mg by mouth 2 (two) times daily.     chlorthalidone (HYGROTON) 25 MG tablet Take 25 mg by mouth every morning.     colchicine 0.6 MG tablet Take 0.6 mg by mouth 2 (two) times daily as needed (gout flare).      LORazepam (  ATIVAN) 0.5 MG tablet Take 0.5 mg by mouth at bedtime as needed for sleep.     losartan (COZAAR) 100 MG tablet Take 100 mg by mouth daily.     Multiple Vitamin (MULTIVITAMIN WITH MINERALS) TABS tablet Take 1 tablet by mouth daily.     naproxen sodium (ALEVE) 220 MG tablet Take 220 mg by mouth 2 (two) times daily as needed (pain).     niacinamide 100 MG tablet Take 100 mg by mouth 2 (two) times daily with a meal.     ofloxacin (OCUFLOX) 0.3 % ophthalmic solution Place 2 drops into both eyes daily as needed (infection).  (Patient not taking: Reported on 03/26/2023)     omeprazole (PRILOSEC) 20 MG  capsule Take 20 mg by mouth daily.     ondansetron (ZOFRAN) 8 MG tablet Take 1 tablet (8 mg total) by mouth every 8 (eight) hours as needed for nausea or vomiting. Start on the third day after cisplatin. 30 tablet 1   potassium chloride SA (KLOR-CON M) 20 MEQ tablet Take 1 tablet (20 mEq total) by mouth 2 (two) times daily. 60 tablet 1   prednisoLONE acetate (PRED FORTE) 1 % ophthalmic suspension Place 1 drop into both eyes daily as needed (allergies).     prochlorperazine (COMPAZINE) 10 MG tablet Take 1 tablet (10 mg total) by mouth every 6 (six) hours as needed (Nausea or vomiting). 30 tablet 1   No current facility-administered medications for this visit.    OBJECTIVE: Vitals:   05/15/23 0836  BP: (!) 152/92  Pulse: 73  Temp: (!) 97.1 F (36.2 C)  SpO2: 97%      Body mass index is 39.66 kg/m.    ECOG FS:0 - Asymptomatic  General: Well-developed, well-nourished, no acute distress. Eyes: Pink conjunctiva, anicteric sclera. HEENT: Normocephalic, moist mucous membranes.  Left neck mass significantly decreased in size. Lungs: No audible wheezing or coughing. Heart: Regular rate and rhythm. Abdomen: Soft, nontender, no obvious distention. Musculoskeletal: No edema, cyanosis, or clubbing. Neuro: Alert, answering all questions appropriately. Cranial nerves grossly intact. Skin: No rashes or petechiae noted. Psych: Normal affect.   LAB RESULTS:  Lab Results  Component Value Date   NA 134 (L) 05/15/2023   K 3.3 (L) 05/15/2023   CL 96 (L) 05/15/2023   CO2 26 05/15/2023   GLUCOSE 146 (H) 05/15/2023   BUN 19 05/15/2023   CREATININE 1.15 05/15/2023   CALCIUM 8.4 (L) 05/15/2023   GFRNONAA >60 05/15/2023   GFRAA >60 03/29/2020    Lab Results  Component Value Date   WBC 5.0 05/15/2023   NEUTROABS 3.7 05/15/2023   HGB 14.1 05/15/2023   HCT 40.0 05/15/2023   MCV 92.6 05/15/2023   PLT 218 05/15/2023     STUDIES: No results found.  ASSESSMENT: Stage IVa squamous cell  carcinoma of the parotid gland.  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  Biopsy confirmed squamous cell carcinoma.  PET scan results from April 04, 2023 reviewed independently with hypermetabolism in left parotid gland as well as several cervical lymph nodes.  Patient will benefit from concurrent XRT along with weekly cisplatin.  Patient has declined port placement.  Proceed with cycle 4 of weekly cisplatin today.  Continue daily XRT.  Return to clinic in 1 week for further evaluation and consideration of cycle 5.   Hypokalemia: Mild.  Continue  oral potassium twice per day.  He has also been given dietary instructions.  Patient will receive 20 mEq IV potassium today as well. Hypomagnesia: Magnesium slightly worse today at 1.3.  Continue 4 g IV magnesium along with his treatments. Renal insufficiency: Resolved.  Monitor closely while giving cisplatin.  I spent a total of 30 minutes reviewing chart data, face-to-face evaluation with the patient, counseling and coordination of care as detailed above.   Patient expressed understanding and was in agreement with this plan. He also understands that He can call clinic at any time with any questions, concerns, or complaints.    Cancer Staging  Primary squamous cell carcinoma of parotid gland [facility]) Staging form: Major Salivary Glands, AJCC 8th Edition - Clinical stage from 04/15/2023: Stage IVA (cT2, cN2b, cM0) - Signed by Jeralyn Ruths, MD on 04/15/2023   Jeralyn Ruths, MD   05/15/2023 8:42 AM

## 2023-05-15 NOTE — Progress Notes (Signed)
Extra magnesium given IV today for hypomagnesemia. Per Dr. Orlie Dakin, ok to run post fluids+potassium with cisplatin. Patine tolerated treatment well. BP was elevated pre and post treatment. MD aware.

## 2023-05-16 ENCOUNTER — Other Ambulatory Visit: Payer: Self-pay

## 2023-05-16 ENCOUNTER — Ambulatory Visit
Admission: RE | Admit: 2023-05-16 | Discharge: 2023-05-16 | Disposition: A | Discharge status: Home / Self Care | Payer: Medicare PPO | Source: Ambulatory Visit | Attending: Radiation Oncology | Admitting: Radiation Oncology

## 2023-05-16 DIAGNOSIS — Z510 Encounter for antineoplastic radiation therapy: Secondary | ICD-10-CM | POA: Diagnosis not present

## 2023-05-16 LAB — RAD ONC ARIA SESSION SUMMARY
Course Elapsed Days: 16
Plan Fractions Treated to Date: 12
Plan Prescribed Dose Per Fraction: 2 Gy
Plan Total Fractions Prescribed: 33
Plan Total Prescribed Dose: 66 Gy
Reference Point Dosage Given to Date: 24 Gy
Reference Point Session Dosage Given: 2 Gy
Session Number: 12

## 2023-05-17 ENCOUNTER — Other Ambulatory Visit: Payer: Self-pay

## 2023-05-17 ENCOUNTER — Ambulatory Visit
Admission: RE | Admit: 2023-05-17 | Discharge: 2023-05-17 | Disposition: A | Discharge status: Home / Self Care | Payer: Medicare PPO | Source: Ambulatory Visit | Attending: Radiation Oncology | Admitting: Radiation Oncology

## 2023-05-17 DIAGNOSIS — Z510 Encounter for antineoplastic radiation therapy: Secondary | ICD-10-CM | POA: Diagnosis not present

## 2023-05-17 LAB — RAD ONC ARIA SESSION SUMMARY
Course Elapsed Days: 17
Plan Fractions Treated to Date: 13
Plan Prescribed Dose Per Fraction: 2 Gy
Plan Total Fractions Prescribed: 33
Plan Total Prescribed Dose: 66 Gy
Reference Point Dosage Given to Date: 26 Gy
Reference Point Session Dosage Given: 2 Gy
Session Number: 13

## 2023-05-20 ENCOUNTER — Other Ambulatory Visit: Payer: Self-pay

## 2023-05-20 ENCOUNTER — Ambulatory Visit
Admission: RE | Admit: 2023-05-20 | Discharge: 2023-05-20 | Disposition: A | Discharge status: Home / Self Care | Payer: Medicare PPO | Source: Ambulatory Visit | Attending: Radiation Oncology | Admitting: Radiation Oncology

## 2023-05-20 DIAGNOSIS — Z808 Family history of malignant neoplasm of other organs or systems: Secondary | ICD-10-CM | POA: Insufficient documentation

## 2023-05-20 DIAGNOSIS — C07 Malignant neoplasm of parotid gland: Secondary | ICD-10-CM | POA: Diagnosis not present

## 2023-05-20 DIAGNOSIS — Z510 Encounter for antineoplastic radiation therapy: Secondary | ICD-10-CM | POA: Insufficient documentation

## 2023-05-20 DIAGNOSIS — Z5111 Encounter for antineoplastic chemotherapy: Secondary | ICD-10-CM | POA: Insufficient documentation

## 2023-05-20 DIAGNOSIS — E876 Hypokalemia: Secondary | ICD-10-CM | POA: Insufficient documentation

## 2023-05-20 LAB — RAD ONC ARIA SESSION SUMMARY
Course Elapsed Days: 20
Plan Fractions Treated to Date: 14
Plan Prescribed Dose Per Fraction: 2 Gy
Plan Total Fractions Prescribed: 33
Plan Total Prescribed Dose: 66 Gy
Reference Point Dosage Given to Date: 28 Gy
Reference Point Session Dosage Given: 2 Gy
Session Number: 14

## 2023-05-21 ENCOUNTER — Other Ambulatory Visit: Payer: Self-pay

## 2023-05-21 ENCOUNTER — Ambulatory Visit
Admission: RE | Admit: 2023-05-21 | Discharge: 2023-05-21 | Disposition: A | Discharge status: Home / Self Care | Payer: Medicare PPO | Source: Ambulatory Visit | Attending: Radiation Oncology | Admitting: Radiation Oncology

## 2023-05-21 DIAGNOSIS — Z510 Encounter for antineoplastic radiation therapy: Secondary | ICD-10-CM | POA: Diagnosis not present

## 2023-05-21 LAB — RAD ONC ARIA SESSION SUMMARY
Course Elapsed Days: 21
Plan Fractions Treated to Date: 1
Plan Prescribed Dose Per Fraction: 2 Gy
Plan Total Fractions Prescribed: 19
Plan Total Prescribed Dose: 38 Gy
Reference Point Dosage Given to Date: 30 Gy
Reference Point Session Dosage Given: 2 Gy
Session Number: 15

## 2023-05-21 MED FILL — Dexamethasone Sodium Phosphate Inj 100 MG/10ML: INTRAMUSCULAR | Qty: 1 | Status: AC

## 2023-05-21 MED FILL — Fosaprepitant Dimeglumine For IV Infusion 150 MG (Base Eq): INTRAVENOUS | Qty: 5 | Status: AC

## 2023-05-22 ENCOUNTER — Ambulatory Visit: Payer: Medicare PPO | Admitting: Oncology

## 2023-05-22 ENCOUNTER — Other Ambulatory Visit: Payer: Medicare PPO

## 2023-05-22 ENCOUNTER — Inpatient Hospital Stay (HOSPITAL_BASED_OUTPATIENT_CLINIC_OR_DEPARTMENT_OTHER): Payer: Medicare PPO | Admitting: Oncology

## 2023-05-22 ENCOUNTER — Ambulatory Visit
Admission: RE | Admit: 2023-05-22 | Discharge: 2023-05-22 | Disposition: A | Discharge status: Home / Self Care | Payer: Medicare PPO | Source: Ambulatory Visit | Attending: Radiation Oncology | Admitting: Radiation Oncology

## 2023-05-22 ENCOUNTER — Ambulatory Visit: Payer: Medicare PPO

## 2023-05-22 ENCOUNTER — Inpatient Hospital Stay: Payer: Medicare PPO

## 2023-05-22 ENCOUNTER — Other Ambulatory Visit: Payer: Self-pay

## 2023-05-22 ENCOUNTER — Encounter: Payer: Self-pay | Admitting: Oncology

## 2023-05-22 VITALS — BP 158/96 | HR 68 | Temp 98.3°F | Resp 20

## 2023-05-22 VITALS — BP 164/92 | HR 67 | Temp 99.8°F | Resp 18 | Ht 72.0 in | Wt 290.0 lb

## 2023-05-22 DIAGNOSIS — E876 Hypokalemia: Secondary | ICD-10-CM | POA: Insufficient documentation

## 2023-05-22 DIAGNOSIS — C07 Malignant neoplasm of parotid gland: Secondary | ICD-10-CM

## 2023-05-22 DIAGNOSIS — D696 Thrombocytopenia, unspecified: Secondary | ICD-10-CM | POA: Insufficient documentation

## 2023-05-22 DIAGNOSIS — D709 Neutropenia, unspecified: Secondary | ICD-10-CM | POA: Insufficient documentation

## 2023-05-22 DIAGNOSIS — Z808 Family history of malignant neoplasm of other organs or systems: Secondary | ICD-10-CM | POA: Insufficient documentation

## 2023-05-22 DIAGNOSIS — Z510 Encounter for antineoplastic radiation therapy: Secondary | ICD-10-CM | POA: Diagnosis not present

## 2023-05-22 DIAGNOSIS — R1310 Dysphagia, unspecified: Secondary | ICD-10-CM | POA: Insufficient documentation

## 2023-05-22 DIAGNOSIS — Z5111 Encounter for antineoplastic chemotherapy: Secondary | ICD-10-CM | POA: Insufficient documentation

## 2023-05-22 DIAGNOSIS — R682 Dry mouth, unspecified: Secondary | ICD-10-CM | POA: Insufficient documentation

## 2023-05-22 DIAGNOSIS — N289 Disorder of kidney and ureter, unspecified: Secondary | ICD-10-CM | POA: Insufficient documentation

## 2023-05-22 LAB — CBC WITH DIFFERENTIAL (CANCER CENTER ONLY)
Abs Immature Granulocytes: 0.01 10*3/uL (ref 0.00–0.07)
Basophils Absolute: 0 10*3/uL (ref 0.0–0.1)
Basophils Relative: 1 %
Eosinophils Absolute: 0 10*3/uL (ref 0.0–0.5)
Eosinophils Relative: 0 %
HCT: 37 % — ABNORMAL LOW (ref 39.0–52.0)
Hemoglobin: 13.1 g/dL (ref 13.0–17.0)
Immature Granulocytes: 0 %
Lymphocytes Relative: 14 %
Lymphs Abs: 0.6 10*3/uL — ABNORMAL LOW (ref 0.7–4.0)
MCH: 32.7 pg (ref 26.0–34.0)
MCHC: 35.4 g/dL (ref 30.0–36.0)
MCV: 92.3 fL (ref 80.0–100.0)
Monocytes Absolute: 0.3 10*3/uL (ref 0.1–1.0)
Monocytes Relative: 8 %
Neutro Abs: 3.1 10*3/uL (ref 1.7–7.7)
Neutrophils Relative %: 77 %
Platelet Count: 120 10*3/uL — ABNORMAL LOW (ref 150–400)
RBC: 4.01 MIL/uL — ABNORMAL LOW (ref 4.22–5.81)
RDW: 12.6 % (ref 11.5–15.5)
WBC Count: 4 10*3/uL (ref 4.0–10.5)
nRBC: 0 % (ref 0.0–0.2)

## 2023-05-22 LAB — BASIC METABOLIC PANEL - CANCER CENTER ONLY
Anion gap: 12 (ref 5–15)
BUN: 19 mg/dL (ref 8–23)
CO2: 25 mmol/L (ref 22–32)
Calcium: 7.9 mg/dL — ABNORMAL LOW (ref 8.9–10.3)
Chloride: 96 mmol/L — ABNORMAL LOW (ref 98–111)
Creatinine: 1.24 mg/dL (ref 0.61–1.24)
GFR, Estimated: >60 mL/min (ref >60)
Glucose, Bld: 173 mg/dL — ABNORMAL HIGH (ref 70–99)
Potassium: 3.4 mmol/L — ABNORMAL LOW (ref 3.5–5.1)
Sodium: 133 mmol/L — ABNORMAL LOW (ref 135–145)

## 2023-05-22 LAB — RAD ONC ARIA SESSION SUMMARY
Course Elapsed Days: 22
Plan Fractions Treated to Date: 2
Plan Prescribed Dose Per Fraction: 2 Gy
Plan Total Fractions Prescribed: 19
Plan Total Prescribed Dose: 38 Gy
Reference Point Dosage Given to Date: 32 Gy
Reference Point Session Dosage Given: 2 Gy
Session Number: 16

## 2023-05-22 LAB — MAGNESIUM: Magnesium: 1.3 mg/dL — ABNORMAL LOW (ref 1.7–2.4)

## 2023-05-22 MED ORDER — SODIUM CHLORIDE 0.9 % IV SOLN
10.0000 mg | Freq: Once | INTRAVENOUS | Status: AC
  Administered 2023-05-22: 10 mg via INTRAVENOUS
  Filled 2023-05-22: qty 10

## 2023-05-22 MED ORDER — SODIUM CHLORIDE 0.9 % IV SOLN
Freq: Once | INTRAVENOUS | Status: AC
  Filled 2023-05-22: qty 250

## 2023-05-22 MED ORDER — PALONOSETRON HCL INJECTION 0.25 MG/5ML
0.2500 mg | Freq: Once | INTRAVENOUS | Status: AC
  Administered 2023-05-22: 0.25 mg via INTRAVENOUS
  Filled 2023-05-22: qty 5

## 2023-05-22 MED ORDER — SODIUM CHLORIDE 0.9 % IV SOLN
40.0000 mg/m2 | Freq: Once | INTRAVENOUS | Status: AC
  Administered 2023-05-22: 100 mg via INTRAVENOUS
  Filled 2023-05-22: qty 100

## 2023-05-22 MED ORDER — SODIUM CHLORIDE 0.9 % IV SOLN
150.0000 mg | Freq: Once | INTRAVENOUS | Status: AC
  Administered 2023-05-22: 150 mg via INTRAVENOUS
  Filled 2023-05-22: qty 150

## 2023-05-22 MED ORDER — POTASSIUM CHLORIDE IN NACL 20-0.9 MEQ/L-% IV SOLN
Freq: Once | INTRAVENOUS | Status: AC
  Filled 2023-05-22: qty 1000

## 2023-05-22 MED ORDER — MAGNESIUM SULFATE 4 GM/100ML IV SOLN
4.0000 g | Freq: Once | INTRAVENOUS | Status: AC
  Administered 2023-05-22: 4 g via INTRAVENOUS
  Filled 2023-05-22: qty 100

## 2023-05-22 NOTE — Patient Instructions (Signed)
[facility] [place] [facility]  Discharge Instructions: Thank you for choosing [facility] to provide your oncology and hematology care.  If you have a lab appointment with the Cancer Center, please go directly to the Cancer Center and check in at the registration area.  Wear comfortable clothing and clothing appropriate for easy access to any Portacath or PICC line.   We strive to give you quality time with your provider. You may need to reschedule your appointment if you arrive late (15 or more minutes).  Arriving late affects you and other patients whose appointments are after yours.  Also, if you miss three or more appointments without notifying the office, you may be dismissed from the clinic at the provider's discretion.      For prescription refill requests, have your pharmacy contact our office and allow 72 hours for refills to be completed.    Today you received the following chemotherapy and/or immunotherapy agents Cisplatin    To help prevent nausea and vomiting after your treatment, we encourage you to take your nausea medication as directed.  BELOW ARE SYMPTOMS THAT SHOULD BE REPORTED IMMEDIATELY: *FEVER GREATER THAN 100.4 F (38 C) OR HIGHER *CHILLS OR SWEATING *NAUSEA AND VOMITING THAT IS NOT CONTROLLED WITH YOUR NAUSEA MEDICATION *UNUSUAL SHORTNESS OF BREATH *UNUSUAL BRUISING OR BLEEDING *URINARY PROBLEMS (pain or burning when urinating, or frequent urination) *BOWEL PROBLEMS (unusual diarrhea, constipation, pain near the anus) TENDERNESS IN MOUTH AND THROAT WITH OR WITHOUT PRESENCE OF ULCERS (sore throat, sores in mouth, or a toothache) UNUSUAL RASH, SWELLING OR PAIN  UNUSUAL VAGINAL DISCHARGE OR ITCHING   Items with * indicate a potential emergency and should be followed up as soon as possible or go to the Emergency Department if any problems should occur.  Please show the CHEMOTHERAPY ALERT CARD or IMMUNOTHERAPY ALERT CARD at check-in to  the Emergency Department and triage nurse.  Should you have questions after your visit or need to cancel or reschedule your appointment, please contact [facility] [facility]  [phone] and follow the prompts.  Office hours are 8:00 a.m. to 4:30 p.m. Monday - Friday. Please note that voicemails left after 4:00 p.m. may not be returned until the following business day.  We are closed weekends and major holidays. You have access to a nurse at all times for urgent questions. Please call the main number to the clinic [phone] and follow the prompts.  For any non-urgent questions, you may also contact your provider using MyChart. We now offer e-Visits for anyone 18 and older to request care online for non-urgent symptoms. For details visit [email or web address].   Also download the MyChart app! Go to the app store, search "MyChart", open the app, select [facility], and log in with your MyChart username and password.    Cisplatin Injection What is this medication? CISPLATIN (SIS pla tin) treats some types of cancer. It works by slowing down the growth of cancer cells. This medicine may be used for other purposes; ask your health care provider or pharmacist if you have questions. COMMON BRAND NAME(S): Platinol, Platinol -AQ What should I tell my care team before I take this medication? They need to know if you have any of these conditions: Eye disease, vision problems Hearing problems Kidney disease Low blood counts, such as low white cells, platelets, or red blood cells Tingling of the fingers or toes, or other nerve disorder An unusual or allergic reaction to cisplatin, carboplatin, oxaliplatin, other   medications, foods, dyes, or preservatives If you or your partner are pregnant or trying to get pregnant Breast-feeding How should I use this medication? This medication is injected into a vein. It is given by your care team in a hospital or clinic setting. Talk to  your care team about the use of this medication in children. Special care may be needed. Overdosage: If you think you have taken too much of this medicine contact a poison control center or emergency room at once. NOTE: This medicine is only for you. Do not share this medicine with others. What if I miss a dose? Keep appointments for follow-up doses. It is important not to miss your dose. Call your care team if you are unable to keep an appointment. What may interact with this medication? Do not take this medication with any of the following: Live virus vaccines This medication may also interact with the following: Certain antibiotics, such as amikacin, gentamicin, neomycin, polymyxin B, streptomycin, tobramycin, vancomycin Foscarnet This list may not describe all possible interactions. Give your health care provider a list of all the medicines, herbs, non-prescription drugs, or dietary supplements you use. Also tell them if you smoke, drink alcohol, or use illegal drugs. Some items may interact with your medicine. What should I watch for while using this medication? Your condition will be monitored carefully while you are receiving this medication. You may need blood work done while taking this medication. This medication may make you feel generally unwell. This is not uncommon, as chemotherapy can affect healthy cells as well as cancer cells. Report any side effects. Continue your course of treatment even though you feel ill unless your care team tells you to stop. This medication may increase your risk of getting an infection. Call your care team for advice if you get a fever, chills, sore throat, or other symptoms of a cold or flu. Do not treat yourself. Try to avoid being around people who are sick. Avoid taking medications that contain aspirin, acetaminophen, ibuprofen, naproxen, or ketoprofen unless instructed by your care team. These medications may hide a fever. This medication may increase  your risk to bruise or bleed. Call your care team if you notice any unusual bleeding. Be careful brushing or flossing your teeth or using a toothpick because you may get an infection or bleed more easily. If you have any dental work done, tell your dentist you are receiving this medication. Drink fluids as directed while you are taking this medication. This will help protect your kidneys. Call your care team if you get diarrhea. Do not treat yourself. Talk to your care team if you or your partner wish to become pregnant or think you might be pregnant. This medication can cause serious birth defects if taken during pregnancy and for 14 months after the last dose. A negative pregnancy test is required before starting this medication. A reliable form of contraception is recommended while taking this medication and for 14 months after the last dose. Talk to your care team about effective forms of contraception. Do not father a child while taking this medication and for 11 months after the last dose. Use a condom during sex during this time period. Do not breast-feed while taking this medication. This medication may cause infertility. Talk to your care team if you are concerned about your fertility. What side effects may I notice from receiving this medication? Side effects that you should report to your care team as soon as possible: Allergic reactions--skin rash,   itching, hives, swelling of the face, lips, tongue, or throat Eye pain, change in vision, vision loss Hearing loss, ringing in ears Infection--fever, chills, cough, sore throat, wounds that don't heal, pain or trouble when passing urine, general feeling of discomfort or being unwell Kidney injury--decrease in the amount of urine, swelling of the ankles, hands, or feet Low red blood cell level--unusual weakness or fatigue, dizziness, headache, trouble breathing Painful swelling, warmth, or redness of the skin, blisters or sores at the infusion  site Pain, tingling, or numbness in the hands or feet Unusual bruising or bleeding Side effects that usually do not require medical attention (report to your care team if they continue or are bothersome): Hair loss Nausea Vomiting This list may not describe all possible side effects. Call your doctor for medical advice about side effects. You may report side effects to FDA at 1-800-FDA-1088. Where should I keep my medication? This medication is given in a hospital or clinic. It will not be stored at home. NOTE: This sheet is a summary. It may not cover all possible information. If you have questions about this medicine, talk to your doctor, pharmacist, or health care provider.  2024 Elsevier/Gold Standard (2022-04-06 00:00:00)  

## 2023-05-22 NOTE — Progress Notes (Signed)
Nutrition Follow-up:  Patient with stage IV SCC of parotid gland.  Receiving cisplatin and radiation.    Met with patient during infusion.  Reports that usually Friday and Saturday after treatment he does not feel good (more fatigue).  Has some nausea and takes medication (usually on Saturday only).  Reports taste alterations of some foods.  Sweets are still tasting good (ice cream, caramel cremes).  Reports that he is not snacking after dinner.   Chicken nuggets, hamburger, baked potato, mashed potatoes are tasting fairly normal.    Reports that tumor has shrunk in his neck.    Medications: reviewed  Labs: Na 133, K 3.4, glucose 173, calcium 7.9, Mag 1.3  Anthropometrics:   Weight 290 lb  294 lb on 5/22 296 lb 5/8 292 lb on 08/27/22  2% weight loss in the last month, not significant   NUTRITION DIAGNOSIS: Predicted sub optimal energy intake ongoing   INTERVENTION:  Discussed oral nutrition supplements.  Samples of ensure complete and boost plus given to patient to try. Recommend 1 a day.   Continue good sources of protein.    MONITORING, EVALUATION, GOAL: weight trends, intake   NEXT VISIT: Wednesday, June 12 during infusion  Kailie Polus B. Freida Busman, RD, LDN Registered Dietitian [phone]

## 2023-05-22 NOTE — Progress Notes (Signed)
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  INTERVAL HISTORY: Patient returns to clinic today for further evaluation and consideration of cycle 5 of weekly cisplatin.  He continues to have dry mouth and altered state, but is otherwise tolerating his treatments well.  He denies any dysphagia.  He has no neurologic complaints.  He denies any recent fevers or illnesses.  He has a good appetite and denies weight loss.  He has no chest pain, shortness of breath, cough, or hemoptysis.  He denies any nausea, vomiting, constipation, or diarrhea.  He has no urinary complaints.  Patient offers no further specific complaints today.  REVIEW OF SYSTEMS:   Review of Systems  Constitutional: Negative.  Negative for fever, malaise/fatigue and weight loss.  Respiratory: Negative.  Negative for cough, hemoptysis and shortness of breath.   Cardiovascular: Negative.  Negative for chest pain and leg swelling.  Gastrointestinal: Negative.  Negative for abdominal pain.  Genitourinary: Negative.  Negative for dysuria.  Musculoskeletal: Negative.  Negative for back pain.  Skin: Negative.  Negative for rash.  Neurological: Negative.  Negative for dizziness, focal weakness, weakness and headaches.  Psychiatric/Behavioral: Negative.  The patient is not nervous/anxious.     As per HPI. Otherwise, a complete review of systems is negative.  PAST MEDICAL HISTORY: Past Medical History:  Diagnosis Date   GERD (gastroesophageal reflux disease)    Gout    Hypertension    PVC (premature ventricular contraction)    Sleep apnea    Squamous cell carcinoma of face     PAST SURGICAL HISTORY: Past Surgical History:  Procedure Laterality Date   KNEE SURGERY  1974    PARATHYROIDECTOMY Left 03/29/2020   Procedure: PARATHYROIDECTOMY;  Surgeon: Linus Salmons, MD;  Location: [facility] ORS;  Service: ENT;  Laterality: Left;    FAMILY HISTORY: Family History  Problem Relation Age of Onset   Hypertension Mother    Cancer Mother        skin cancer   Hypertension Father    Cancer Father        skin   Cancer Sister        skin cancer    ADVANCED DIRECTIVES (Y/N):  N  HEALTH MAINTENANCE: Social History   Tobacco Use   Smoking status: Never   Smokeless tobacco: Never  Substance Use Topics   Alcohol use: Yes    Comment: occasional   Drug use: Never     Colonoscopy:  PAP:  Bone density:  Lipid panel:  No Known Allergies  Current Outpatient Medications  Medication Sig Dispense Refill   acetaminophen (TYLENOL) 500 MG tablet Take 1,000 mg by mouth every 6 (six) hours as needed for moderate pain or headache.     allopurinol (ZYLOPRIM) 100 MG tablet Take 100 mg by mouth daily.     Carboxymethylcellulose Sodium (ARTIFICIAL TEARS OP) Place 1 drop into both eyes daily as needed (dry eyes).     carvedilol (COREG) 25 MG tablet Take 25 mg by mouth 2 (two) times daily.     chlorthalidone (HYGROTON) 25 MG tablet Take 25 mg by mouth every morning.     colchicine 0.6 MG tablet Take 0.6 mg by mouth 2 (two) times daily as needed (gout flare).      LORazepam (ATIVAN) 0.5  MG tablet Take 0.5 mg by mouth at bedtime as needed for sleep.     losartan (COZAAR) 100 MG tablet Take 100 mg by mouth daily.     Multiple Vitamin (MULTIVITAMIN WITH MINERALS) TABS tablet Take 1 tablet by mouth daily.     naproxen sodium (ALEVE) 220 MG tablet Take 220 mg by mouth 2 (two) times daily as needed (pain).     niacinamide 100 MG tablet Take 100 mg by mouth 2 (two) times daily with a meal.     omeprazole (PRILOSEC) 20 MG capsule Take 20 mg by mouth daily.     ondansetron (ZOFRAN) 8 MG tablet Take 1 tablet (8 mg total) by mouth every 8 (eight) hours as needed for nausea or vomiting.  Start on the third day after cisplatin. 30 tablet 1   potassium chloride SA (KLOR-CON M) 20 MEQ tablet Take 1 tablet (20 mEq total) by mouth 2 (two) times daily. 60 tablet 1   prednisoLONE acetate (PRED FORTE) 1 % ophthalmic suspension Place 1 drop into both eyes daily as needed (allergies).     prochlorperazine (COMPAZINE) 10 MG tablet Take 1 tablet (10 mg total) by mouth every 6 (six) hours as needed (Nausea or vomiting). 30 tablet 1   ofloxacin (OCUFLOX) 0.3 % ophthalmic solution Place 2 drops into both eyes daily as needed (infection).  (Patient not taking: Reported on 03/26/2023)     No current facility-administered medications for this visit.    OBJECTIVE: Vitals:   05/22/23 0914  BP: (!) 164/92  Pulse: 67  Resp: 18  Temp: 99.8 F (37.7 C)  SpO2: 100%      Body mass index is 39.33 kg/m.    ECOG FS:0 - Asymptomatic  General: Well-developed, well-nourished, no acute distress. Eyes: Pink conjunctiva, anicteric sclera. HEENT: Normocephalic, moist mucous membranes.  No palpable masses or lymphadenopathy. Lungs: No audible wheezing or coughing. Heart: Regular rate and rhythm. Abdomen: Soft, nontender, no obvious distention. Musculoskeletal: No edema, cyanosis, or clubbing. Neuro: Alert, answering all questions appropriately. Cranial nerves grossly intact. Skin: No rashes or petechiae noted. Psych: Normal affect.  LAB RESULTS:  Lab Results  Component Value Date   NA 133 (L) 05/22/2023   K 3.4 (L) 05/22/2023   CL 96 (L) 05/22/2023   CO2 25 05/22/2023   GLUCOSE 173 (H) 05/22/2023   BUN 19 05/22/2023   CREATININE 1.24 05/22/2023   CALCIUM 7.9 (L) 05/22/2023   GFRNONAA >60 05/22/2023   GFRAA >60 03/29/2020    Lab Results  Component Value Date   WBC 4.0 05/22/2023   NEUTROABS 3.1 05/22/2023   HGB 13.1 05/22/2023   HCT 37.0 (L) 05/22/2023   MCV 92.3 05/22/2023   PLT 120 (L) 05/22/2023     STUDIES: No results found.  ASSESSMENT: Stage IVa squamous cell carcinoma  of the parotid gland.  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  Biopsy confirmed squamous cell carcinoma.  PET scan results from April 04, 2023 reviewed independently with hypermetabolism in left parotid gland as well as several cervical lymph nodes.  Patient will benefit from concurrent XRT along with weekly cisplatin.  Patient has declined port placement.  Proceed with cycle 5 of weekly cisplatin today.  Continue daily XRT completing on June 14, 2023.  Return to clinic in 1 week for further evaluation and consideration of cycle 6.  Hypokalemia: Improved.  Continue oral potassium twice per day.  He has also been given dietary instructions.  Patient will receive 20 mEq IV potassium today as well. Hypomagnesia: Chronic and unchanged.  Patient's magnesium is 1.3 today.  Continue 4 g IV magnesium along with his treatments. Renal insufficiency: Resolved.  Monitor closely while giving cisplatin. Thrombocytopenia: Mild, monitor.  Proceed with treatment as above.   Patient expressed understanding and was in agreement with this plan. He also understands that He can call clinic at any time with any questions, concerns, or complaints.    Cancer Staging  Primary squamous cell carcinoma of parotid gland [facility]) Staging form: Major Salivary Glands, AJCC 8th Edition - Clinical stage from 04/15/2023: Stage IVA (cT2, cN2b, cM0) - Signed by Jeralyn Ruths, MD on 04/15/2023   Jeralyn Ruths, MD   05/22/2023 2:18 PM

## 2023-05-23 ENCOUNTER — Other Ambulatory Visit: Payer: Self-pay

## 2023-05-23 ENCOUNTER — Ambulatory Visit
Admission: RE | Admit: 2023-05-23 | Discharge: 2023-05-23 | Disposition: A | Discharge status: Home / Self Care | Payer: Medicare PPO | Source: Ambulatory Visit | Attending: Radiation Oncology | Admitting: Radiation Oncology

## 2023-05-23 DIAGNOSIS — Z510 Encounter for antineoplastic radiation therapy: Secondary | ICD-10-CM | POA: Diagnosis not present

## 2023-05-23 LAB — RAD ONC ARIA SESSION SUMMARY
Course Elapsed Days: 23
Plan Fractions Treated to Date: 3
Plan Prescribed Dose Per Fraction: 2 Gy
Plan Total Fractions Prescribed: 19
Plan Total Prescribed Dose: 38 Gy
Reference Point Dosage Given to Date: 34 Gy
Reference Point Session Dosage Given: 2 Gy
Session Number: 17

## 2023-05-24 ENCOUNTER — Other Ambulatory Visit: Payer: Self-pay

## 2023-05-24 ENCOUNTER — Ambulatory Visit
Admission: RE | Admit: 2023-05-24 | Discharge: 2023-05-24 | Disposition: A | Discharge status: Home / Self Care | Payer: Medicare PPO | Source: Ambulatory Visit | Attending: Radiation Oncology | Admitting: Radiation Oncology

## 2023-05-24 DIAGNOSIS — Z510 Encounter for antineoplastic radiation therapy: Secondary | ICD-10-CM | POA: Diagnosis not present

## 2023-05-24 LAB — RAD ONC ARIA SESSION SUMMARY
Course Elapsed Days: 24
Plan Fractions Treated to Date: 4
Plan Prescribed Dose Per Fraction: 2 Gy
Plan Total Fractions Prescribed: 19
Plan Total Prescribed Dose: 38 Gy
Reference Point Dosage Given to Date: 36 Gy
Reference Point Session Dosage Given: 2 Gy
Session Number: 18

## 2023-05-27 ENCOUNTER — Other Ambulatory Visit: Payer: Self-pay

## 2023-05-27 ENCOUNTER — Ambulatory Visit
Admission: RE | Admit: 2023-05-27 | Discharge: 2023-05-27 | Disposition: A | Discharge status: Home / Self Care | Payer: Medicare PPO | Source: Ambulatory Visit | Attending: Radiation Oncology | Admitting: Radiation Oncology

## 2023-05-27 DIAGNOSIS — Z510 Encounter for antineoplastic radiation therapy: Secondary | ICD-10-CM | POA: Diagnosis not present

## 2023-05-27 LAB — RAD ONC ARIA SESSION SUMMARY
Course Elapsed Days: 27
Plan Fractions Treated to Date: 5
Plan Prescribed Dose Per Fraction: 2 Gy
Plan Total Fractions Prescribed: 19
Plan Total Prescribed Dose: 38 Gy
Reference Point Dosage Given to Date: 38 Gy
Reference Point Session Dosage Given: 2 Gy
Session Number: 19

## 2023-05-28 ENCOUNTER — Other Ambulatory Visit: Payer: Self-pay

## 2023-05-28 ENCOUNTER — Ambulatory Visit
Admission: RE | Admit: 2023-05-28 | Discharge: 2023-05-28 | Disposition: A | Discharge status: Home / Self Care | Payer: Medicare PPO | Source: Ambulatory Visit | Attending: Radiation Oncology | Admitting: Radiation Oncology

## 2023-05-28 DIAGNOSIS — Z510 Encounter for antineoplastic radiation therapy: Secondary | ICD-10-CM | POA: Diagnosis not present

## 2023-05-28 LAB — RAD ONC ARIA SESSION SUMMARY
Course Elapsed Days: 28
Plan Fractions Treated to Date: 6
Plan Prescribed Dose Per Fraction: 2 Gy
Plan Total Fractions Prescribed: 19
Plan Total Prescribed Dose: 38 Gy
Reference Point Dosage Given to Date: 40 Gy
Reference Point Session Dosage Given: 2 Gy
Session Number: 20

## 2023-05-28 MED FILL — Dexamethasone Sodium Phosphate Inj 100 MG/10ML: INTRAMUSCULAR | Qty: 1 | Status: AC

## 2023-05-28 MED FILL — Fosaprepitant Dimeglumine For IV Infusion 150 MG (Base Eq): INTRAVENOUS | Qty: 5 | Status: AC

## 2023-05-29 ENCOUNTER — Inpatient Hospital Stay: Payer: Medicare PPO

## 2023-05-29 ENCOUNTER — Inpatient Hospital Stay (HOSPITAL_BASED_OUTPATIENT_CLINIC_OR_DEPARTMENT_OTHER): Payer: Medicare PPO | Admitting: Oncology

## 2023-05-29 ENCOUNTER — Ambulatory Visit
Admission: RE | Admit: 2023-05-29 | Discharge: 2023-05-29 | Disposition: A | Discharge status: Home / Self Care | Payer: Medicare PPO | Source: Ambulatory Visit | Attending: Radiation Oncology | Admitting: Radiation Oncology

## 2023-05-29 ENCOUNTER — Other Ambulatory Visit: Payer: Self-pay

## 2023-05-29 DIAGNOSIS — C07 Malignant neoplasm of parotid gland: Secondary | ICD-10-CM

## 2023-05-29 DIAGNOSIS — Z510 Encounter for antineoplastic radiation therapy: Secondary | ICD-10-CM | POA: Diagnosis not present

## 2023-05-29 LAB — RAD ONC ARIA SESSION SUMMARY
Course Elapsed Days: 29
Plan Fractions Treated to Date: 7
Plan Prescribed Dose Per Fraction: 2 Gy
Plan Total Fractions Prescribed: 19
Plan Total Prescribed Dose: 38 Gy
Reference Point Dosage Given to Date: 42 Gy
Reference Point Session Dosage Given: 2 Gy
Session Number: 21

## 2023-05-29 LAB — BASIC METABOLIC PANEL - CANCER CENTER ONLY
Anion gap: 14 (ref 5–15)
BUN: 26 mg/dL — ABNORMAL HIGH (ref 8–23)
CO2: 27 mmol/L (ref 22–32)
Calcium: 6.9 mg/dL — ABNORMAL LOW (ref 8.9–10.3)
Chloride: 95 mmol/L — ABNORMAL LOW (ref 98–111)
Creatinine: 1.38 mg/dL — ABNORMAL HIGH (ref 0.61–1.24)
GFR, Estimated: 56 mL/min — ABNORMAL LOW (ref >60)
Glucose, Bld: 142 mg/dL — ABNORMAL HIGH (ref 70–99)
Potassium: 3.3 mmol/L — ABNORMAL LOW (ref 3.5–5.1)
Sodium: 136 mmol/L (ref 135–145)

## 2023-05-29 LAB — CBC WITH DIFFERENTIAL (CANCER CENTER ONLY)
Abs Immature Granulocytes: 0.03 10*3/uL (ref 0.00–0.07)
Basophils Absolute: 0 10*3/uL (ref 0.0–0.1)
Basophils Relative: 0 %
Eosinophils Absolute: 0 10*3/uL (ref 0.0–0.5)
Eosinophils Relative: 0 %
HCT: 36 % — ABNORMAL LOW (ref 39.0–52.0)
Hemoglobin: 12.9 g/dL — ABNORMAL LOW (ref 13.0–17.0)
Immature Granulocytes: 1 %
Lymphocytes Relative: 15 %
Lymphs Abs: 0.4 10*3/uL — ABNORMAL LOW (ref 0.7–4.0)
MCH: 32.9 pg (ref 26.0–34.0)
MCHC: 35.8 g/dL (ref 30.0–36.0)
MCV: 91.8 fL (ref 80.0–100.0)
Monocytes Absolute: 0.2 10*3/uL (ref 0.1–1.0)
Monocytes Relative: 7 %
Neutro Abs: 2 10*3/uL (ref 1.7–7.7)
Neutrophils Relative %: 77 %
Platelet Count: 77 10*3/uL — ABNORMAL LOW (ref 150–400)
RBC: 3.92 MIL/uL — ABNORMAL LOW (ref 4.22–5.81)
RDW: 12.6 % (ref 11.5–15.5)
WBC Count: 2.6 10*3/uL — ABNORMAL LOW (ref 4.0–10.5)
nRBC: 0 % (ref 0.0–0.2)

## 2023-05-29 LAB — MAGNESIUM: Magnesium: 1.4 mg/dL — ABNORMAL LOW (ref 1.7–2.4)

## 2023-05-29 NOTE — Progress Notes (Signed)
Patient stated that he is having to push his self to eat a little bit now. Also that his mouth has been a little more dry lately.

## 2023-05-29 NOTE — Progress Notes (Signed)
Nutrition  RD planning to see patient during infusion but infusion cancelled for today.  Will follow-up at next infusion.  Bonnie Overdorf B. Freida Busman, RD, LDN Registered Dietitian [phone]

## 2023-05-29 NOTE — Progress Notes (Signed)
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  INTERVAL HISTORY: Patient returns to clinic today for further evaluation and consideration of cycle 6 of weekly cisplatin.  He continues to have dry mouth, but otherwise feels well.  He denies any dysphagia.  He has no neurologic complaints.  He denies any recent fevers or illnesses.  He has a good appetite and denies weight loss.  He has no chest pain, shortness of breath, cough, or hemoptysis.  He denies any nausea, vomiting, constipation, or diarrhea.  He has no urinary complaints.  Patient offers no further specific complaints today.  REVIEW OF SYSTEMS:   Review of Systems  Constitutional: Negative.  Negative for fever, malaise/fatigue and weight loss.  Respiratory: Negative.  Negative for cough, hemoptysis and shortness of breath.   Cardiovascular: Negative.  Negative for chest pain and leg swelling.  Gastrointestinal: Negative.  Negative for abdominal pain.  Genitourinary: Negative.  Negative for dysuria.  Musculoskeletal: Negative.  Negative for back pain.  Skin: Negative.  Negative for rash.  Neurological: Negative.  Negative for dizziness, focal weakness, weakness and headaches.  Psychiatric/Behavioral: Negative.  The patient is not nervous/anxious.     As per HPI. Otherwise, a complete review of systems is negative.  PAST MEDICAL HISTORY: Past Medical History:  Diagnosis Date   GERD (gastroesophageal reflux disease)    Gout    Hypertension    PVC (premature ventricular contraction)    Sleep apnea    Squamous cell carcinoma of face     PAST SURGICAL HISTORY: Past Surgical History:  Procedure Laterality Date   KNEE SURGERY  1974   PARATHYROIDECTOMY Left 03/29/2020   Procedure:  PARATHYROIDECTOMY;  Surgeon: Linus Salmons, MD;  Location: [facility] ORS;  Service: ENT;  Laterality: Left;    FAMILY HISTORY: Family History  Problem Relation Age of Onset   Hypertension Mother    Cancer Mother        skin cancer   Hypertension Father    Cancer Father        skin   Cancer Sister        skin cancer    ADVANCED DIRECTIVES (Y/N):  N  HEALTH MAINTENANCE: Social History   Tobacco Use   Smoking status: Never   Smokeless tobacco: Never  Substance Use Topics   Alcohol use: Yes    Comment: occasional   Drug use: Never     Colonoscopy:  PAP:  Bone density:  Lipid panel:  No Known Allergies  Current Outpatient Medications  Medication Sig Dispense Refill   acetaminophen (TYLENOL) 500 MG tablet Take 1,000 mg by mouth every 6 (six) hours as needed for moderate pain or headache.     allopurinol (ZYLOPRIM) 100 MG tablet Take 100 mg by mouth daily.     Carboxymethylcellulose Sodium (ARTIFICIAL TEARS OP) Place 1 drop into both eyes daily as needed (dry eyes).     carvedilol (COREG) 25 MG tablet Take 25 mg by mouth 2 (two) times daily.     chlorthalidone (HYGROTON) 25 MG tablet Take 25 mg by mouth every morning.     colchicine 0.6 MG tablet Take 0.6 mg by mouth 2 (two) times daily as needed (gout flare).      LORazepam (ATIVAN) 0.5 MG tablet Take 0.5 mg by  mouth at bedtime as needed for sleep.     losartan (COZAAR) 100 MG tablet Take 100 mg by mouth daily.     Multiple Vitamin (MULTIVITAMIN WITH MINERALS) TABS tablet Take 1 tablet by mouth daily.     naproxen sodium (ALEVE) 220 MG tablet Take 220 mg by mouth 2 (two) times daily as needed (pain).     niacinamide 100 MG tablet Take 100 mg by mouth 2 (two) times daily with a meal.     omeprazole (PRILOSEC) 20 MG capsule Take 20 mg by mouth daily.     ondansetron (ZOFRAN) 8 MG tablet Take 1 tablet (8 mg total) by mouth every 8 (eight) hours as needed for nausea or vomiting. Start on the third day after cisplatin. 30 tablet  1   potassium chloride SA (KLOR-CON M) 20 MEQ tablet Take 1 tablet (20 mEq total) by mouth 2 (two) times daily. 60 tablet 1   prednisoLONE acetate (PRED FORTE) 1 % ophthalmic suspension Place 1 drop into both eyes daily as needed (allergies).     prochlorperazine (COMPAZINE) 10 MG tablet Take 1 tablet (10 mg total) by mouth every 6 (six) hours as needed (Nausea or vomiting). 30 tablet 1   ofloxacin (OCUFLOX) 0.3 % ophthalmic solution Place 2 drops into both eyes daily as needed (infection).  (Patient not taking: Reported on 03/26/2023)     No current facility-administered medications for this visit.    OBJECTIVE: There were no vitals filed for this visit.     There is no height or weight on file to calculate BMI.    ECOG FS:0 - Asymptomatic  General: Well-developed, well-nourished, no acute distress. Eyes: Pink conjunctiva, anicteric sclera. HEENT: Normocephalic, moist mucous membranes.  No palpable lymphadenopathy. Lungs: No audible wheezing or coughing. Heart: Regular rate and rhythm. Abdomen: Soft, nontender, no obvious distention. Musculoskeletal: No edema, cyanosis, or clubbing. Neuro: Alert, answering all questions appropriately. Cranial nerves grossly intact. Skin: No rashes or petechiae noted. Psych: Normal affect.   LAB RESULTS:  Lab Results  Component Value Date   NA 136 05/29/2023   K 3.3 (L) 05/29/2023   CL 95 (L) 05/29/2023   CO2 27 05/29/2023   GLUCOSE 142 (H) 05/29/2023   BUN 26 (H) 05/29/2023   CREATININE 1.38 (H) 05/29/2023   CALCIUM 6.9 (L) 05/29/2023   GFRNONAA 56 (L) 05/29/2023   GFRAA >60 03/29/2020    Lab Results  Component Value Date   WBC 2.6 (L) 05/29/2023   NEUTROABS 2.0 05/29/2023   HGB 12.9 (L) 05/29/2023   HCT 36.0 (L) 05/29/2023   MCV 91.8 05/29/2023   PLT 77 (L) 05/29/2023     STUDIES: No results found.  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  PLAN:    Stage IVa squamous cell carcinoma of the parotid  gland: CT scan results from March 11, 2023 reviewed independently with a 2.5 cm nodule within the parotid gland.  Biopsy confirmed squamous cell carcinoma.  PET scan results from April 04, 2023 reviewed independently with hypermetabolism in left parotid gland as well as several cervical lymph nodes.  Patient will benefit from concurrent XRT along with weekly cisplatin.  Patient has declined port placement.  Delay cycle 6 of treatment today secondary to thrombocytopenia. Continue daily XRT completing on June 14, 2023.  Return to clinic in 1 week for further evaluation and reconsideration of cycle 6. Hypokalemia: Chronic and unchanged.  Continue oral potassium twice per day.  Patient was so previously given  dietary recommendations.   Hypomagnesia: Chronic and unchanged.  Patient's magnesium is 1.4 today.  Continue 4 g IV magnesium along with his treatments. Renal insufficiency: Mild.  Patient's creatinine is 1.38 today.  Delay treatment as above. Thrombocytopenia: Platelets are 77.  Delay treatment as above.   Patient expressed understanding and was in agreement with this plan. He also understands that He can call clinic at any time with any questions, concerns, or complaints.    Cancer Staging  Primary squamous cell carcinoma of parotid gland [facility]) Staging form: Major Salivary Glands, AJCC 8th Edition - Clinical stage from 04/15/2023: Stage IVA (cT2, cN2b, cM0) - Signed by Jeralyn Ruths, MD on 04/15/2023   Jeralyn Ruths, MD   05/29/2023 1:17 PM

## 2023-05-30 ENCOUNTER — Ambulatory Visit: Payer: Medicare PPO

## 2023-05-30 ENCOUNTER — Ambulatory Visit: Admission: RE | Admit: 2023-05-30 | Payer: Medicare PPO | Source: Ambulatory Visit

## 2023-05-31 ENCOUNTER — Ambulatory Visit: Payer: Medicare PPO

## 2023-05-31 ENCOUNTER — Other Ambulatory Visit: Payer: Self-pay

## 2023-06-03 ENCOUNTER — Ambulatory Visit: Payer: Medicare PPO

## 2023-06-04 ENCOUNTER — Ambulatory Visit: Payer: Medicare PPO

## 2023-06-04 MED FILL — Fosaprepitant Dimeglumine For IV Infusion 150 MG (Base Eq): INTRAVENOUS | Qty: 5 | Status: AC

## 2023-06-04 MED FILL — Dexamethasone Sodium Phosphate Inj 100 MG/10ML: INTRAMUSCULAR | Qty: 1 | Status: AC

## 2023-06-05 ENCOUNTER — Encounter: Payer: Self-pay | Admitting: Oncology

## 2023-06-05 ENCOUNTER — Inpatient Hospital Stay: Payer: Medicare PPO

## 2023-06-05 ENCOUNTER — Inpatient Hospital Stay (HOSPITAL_BASED_OUTPATIENT_CLINIC_OR_DEPARTMENT_OTHER): Payer: Medicare PPO | Admitting: Oncology

## 2023-06-05 ENCOUNTER — Other Ambulatory Visit: Payer: Self-pay

## 2023-06-05 ENCOUNTER — Ambulatory Visit
Admission: RE | Admit: 2023-06-05 | Discharge: 2023-06-05 | Disposition: A | Discharge status: Home / Self Care | Payer: Medicare PPO | Source: Ambulatory Visit | Attending: Radiation Oncology | Admitting: Radiation Oncology

## 2023-06-05 VITALS — BP 133/85 | HR 71 | Temp 98.2°F | Resp 18 | Ht 73.0 in | Wt 281.9 lb

## 2023-06-05 DIAGNOSIS — Z510 Encounter for antineoplastic radiation therapy: Secondary | ICD-10-CM | POA: Diagnosis not present

## 2023-06-05 DIAGNOSIS — C07 Malignant neoplasm of parotid gland: Secondary | ICD-10-CM | POA: Diagnosis not present

## 2023-06-05 LAB — BASIC METABOLIC PANEL - CANCER CENTER ONLY
Anion gap: 13 (ref 5–15)
BUN: 23 mg/dL (ref 8–23)
CO2: 25 mmol/L (ref 22–32)
Calcium: 8.7 mg/dL — ABNORMAL LOW (ref 8.9–10.3)
Chloride: 97 mmol/L — ABNORMAL LOW (ref 98–111)
Creatinine: 1.4 mg/dL — ABNORMAL HIGH (ref 0.61–1.24)
GFR, Estimated: 55 mL/min — ABNORMAL LOW (ref >60)
Glucose, Bld: 158 mg/dL — ABNORMAL HIGH (ref 70–99)
Potassium: 3.3 mmol/L — ABNORMAL LOW (ref 3.5–5.1)
Sodium: 135 mmol/L (ref 135–145)

## 2023-06-05 LAB — RAD ONC ARIA SESSION SUMMARY
Course Elapsed Days: 36
Plan Fractions Treated to Date: 8
Plan Prescribed Dose Per Fraction: 2 Gy
Plan Total Fractions Prescribed: 19
Plan Total Prescribed Dose: 38 Gy
Reference Point Dosage Given to Date: 44 Gy
Reference Point Session Dosage Given: 2 Gy
Session Number: 22

## 2023-06-05 LAB — CBC WITH DIFFERENTIAL (CANCER CENTER ONLY)
Abs Immature Granulocytes: 0 10*3/uL (ref 0.00–0.07)
Basophils Absolute: 0 10*3/uL (ref 0.0–0.1)
Basophils Relative: 1 %
Eosinophils Absolute: 0 10*3/uL (ref 0.0–0.5)
Eosinophils Relative: 1 %
HCT: 33.8 % — ABNORMAL LOW (ref 39.0–52.0)
Hemoglobin: 12.1 g/dL — ABNORMAL LOW (ref 13.0–17.0)
Immature Granulocytes: 0 %
Lymphocytes Relative: 32 %
Lymphs Abs: 0.4 10*3/uL — ABNORMAL LOW (ref 0.7–4.0)
MCH: 33 pg (ref 26.0–34.0)
MCHC: 35.8 g/dL (ref 30.0–36.0)
MCV: 92.1 fL (ref 80.0–100.0)
Monocytes Absolute: 0.1 10*3/uL (ref 0.1–1.0)
Monocytes Relative: 10 %
Neutro Abs: 0.7 10*3/uL — ABNORMAL LOW (ref 1.7–7.7)
Neutrophils Relative %: 56 %
Platelet Count: 150 10*3/uL (ref 150–400)
RBC: 3.67 MIL/uL — ABNORMAL LOW (ref 4.22–5.81)
RDW: 13 % (ref 11.5–15.5)
WBC Count: 1.2 10*3/uL — ABNORMAL LOW (ref 4.0–10.5)
nRBC: 0 % (ref 0.0–0.2)

## 2023-06-05 LAB — MAGNESIUM: Magnesium: 1.4 mg/dL — ABNORMAL LOW (ref 1.7–2.4)

## 2023-06-05 NOTE — Progress Notes (Signed)
Nutrition  Patient with stage IV SCC of parotid gland.  Receiving cisplatin and radiation.  Chemo held again this week due to low WBC.    RD called patient via phone for nutrition follow-up since chemotherapy cancelled for today.  Reports that he is eating.  Usually has banana and 2 fig newtons with medication each morning.  Then will eat 2-3 eggs with bacon.  Dinner maybe Automotive engineer or hamburger or pizza.  Says that he can't tolerate foods that have spice to them or even salt, pepper, etc.  Tried the ensure shakes and has been drinking some oral nutrition supplements.  Denies trouble swallowing, sores in mouth or mouth pain. Says that he is sleeping better.  Says that he has stopped eating junk foods  Weight today 281 lb 14.4 oz  290 lb on 6/5 294 lb on 5/22 296 lb on 5/8 292 lb on 9/11  5% weight loss in the last month  Predicted sub optimal energy intake  Intervention: Continue oral nutrition supplements Continue high calorie, high protein foods to maintain weight  Next visit:  Wednesday, June 26 during infusion  Jonnell Hentges B. Freida Busman, RD, LDN Registered Dietitian [phone]

## 2023-06-05 NOTE — Progress Notes (Signed)
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  INTERVAL HISTORY: Patient returns to clinic today for further evaluation and reconsideration of cycle 6 of weekly cisplatin.  He continues to have dry mouth and mild dysphagia, but otherwise feels well. He has no neurologic complaints.  He denies any recent fevers or illnesses.  He has a good appetite and denies weight loss.  He has no chest pain, shortness of breath, cough, or hemoptysis.  He denies any nausea, vomiting, constipation, or diarrhea.  He has no urinary complaints.  Patient offers no further specific complaints today.  REVIEW OF SYSTEMS:   Review of Systems  Constitutional: Negative.  Negative for fever, malaise/fatigue and weight loss.  Respiratory: Negative.  Negative for cough, hemoptysis and shortness of breath.   Cardiovascular: Negative.  Negative for chest pain and leg swelling.  Gastrointestinal: Negative.  Negative for abdominal pain.  Genitourinary: Negative.  Negative for dysuria.  Musculoskeletal: Negative.  Negative for back pain.  Skin: Negative.  Negative for rash.  Neurological: Negative.  Negative for dizziness, focal weakness, weakness and headaches.  Psychiatric/Behavioral: Negative.  The patient is not nervous/anxious.     As per HPI. Otherwise, a complete review of systems is negative.  PAST MEDICAL HISTORY: Past Medical History:  Diagnosis Date   GERD (gastroesophageal reflux disease)    Gout    Hypertension    PVC (premature ventricular contraction)    Sleep apnea    Squamous cell carcinoma of face     PAST SURGICAL HISTORY: Past Surgical History:  Procedure Laterality Date   KNEE SURGERY  1974   PARATHYROIDECTOMY Left 03/29/2020   Procedure:  PARATHYROIDECTOMY;  Surgeon: Linus Salmons, MD;  Location: [facility] ORS;  Service: ENT;  Laterality: Left;    FAMILY HISTORY: Family History  Problem Relation Age of Onset   Hypertension Mother    Cancer Mother        skin cancer   Hypertension Father    Cancer Father        skin   Cancer Sister        skin cancer    ADVANCED DIRECTIVES (Y/N):  N  HEALTH MAINTENANCE: Social History   Tobacco Use   Smoking status: Never   Smokeless tobacco: Never  Substance Use Topics   Alcohol use: Yes    Comment: occasional   Drug use: Never     Colonoscopy:  PAP:  Bone density:  Lipid panel:  No Known Allergies  Current Outpatient Medications  Medication Sig Dispense Refill   acetaminophen (TYLENOL) 500 MG tablet Take 1,000 mg by mouth every 6 (six) hours as needed for moderate pain or headache.     allopurinol (ZYLOPRIM) 100 MG tablet Take 100 mg by mouth daily.     Carboxymethylcellulose Sodium (ARTIFICIAL TEARS OP) Place 1 drop into both eyes daily as needed (dry eyes).     carvedilol (COREG) 25 MG tablet Take 25 mg by mouth 2 (two) times daily.     chlorthalidone (HYGROTON) 25 MG tablet Take 25 mg by mouth every morning.     colchicine 0.6 MG tablet Take 0.6 mg by mouth 2 (two) times daily as needed (gout flare).      LORazepam (ATIVAN) 0.5 MG tablet Take 0.5 mg by mouth at bedtime  as needed for sleep.     losartan (COZAAR) 100 MG tablet Take 100 mg by mouth daily.     Multiple Vitamin (MULTIVITAMIN WITH MINERALS) TABS tablet Take 1 tablet by mouth daily.     naproxen sodium (ALEVE) 220 MG tablet Take 220 mg by mouth 2 (two) times daily as needed (pain).     niacinamide 100 MG tablet Take 100 mg by mouth 2 (two) times daily with a meal.     ofloxacin (OCUFLOX) 0.3 % ophthalmic solution Place 2 drops into both eyes daily as needed (infection).     omeprazole (PRILOSEC) 20 MG capsule Take 20 mg by mouth daily.     ondansetron (ZOFRAN) 8 MG tablet Take 1 tablet (8 mg total) by  mouth every 8 (eight) hours as needed for nausea or vomiting. Start on the third day after cisplatin. 30 tablet 1   potassium chloride SA (KLOR-CON M) 20 MEQ tablet Take 1 tablet (20 mEq total) by mouth 2 (two) times daily. 60 tablet 1   prednisoLONE acetate (PRED FORTE) 1 % ophthalmic suspension Place 1 drop into both eyes daily as needed (allergies).     prochlorperazine (COMPAZINE) 10 MG tablet Take 1 tablet (10 mg total) by mouth every 6 (six) hours as needed (Nausea or vomiting). 30 tablet 1   No current facility-administered medications for this visit.    OBJECTIVE: Vitals:   06/05/23 0908 06/05/23 0912  BP: (!) 156/87 133/85  Pulse: 67 71  Resp: 18   Temp: 98.2 F (36.8 C)   SpO2: 100%        Body mass index is 37.19 kg/m.    ECOG FS:0 - Asymptomatic  General: Well-developed, well-nourished, no acute distress. Eyes: Pink conjunctiva, anicteric sclera. HEENT: Normocephalic, moist mucous membranes.  No palpable lymphadenopathy. Lungs: No audible wheezing or coughing. Heart: Regular rate and rhythm. Abdomen: Soft, nontender, no obvious distention. Musculoskeletal: No edema, cyanosis, or clubbing. Neuro: Alert, answering all questions appropriately. Cranial nerves grossly intact. Skin: No rashes or petechiae noted. Psych: Normal affect.  LAB RESULTS:  Lab Results  Component Value Date   NA 135 06/05/2023   K 3.3 (L) 06/05/2023   CL 97 (L) 06/05/2023   CO2 25 06/05/2023   GLUCOSE 158 (H) 06/05/2023   BUN 23 06/05/2023   CREATININE 1.40 (H) 06/05/2023   CALCIUM 8.7 (L) 06/05/2023   GFRNONAA 55 (L) 06/05/2023   GFRAA >60 03/29/2020    Lab Results  Component Value Date   WBC 1.2 (L) 06/05/2023   NEUTROABS 0.7 (L) 06/05/2023   HGB 12.1 (L) 06/05/2023   HCT 33.8 (L) 06/05/2023   MCV 92.1 06/05/2023   PLT 150 06/05/2023     STUDIES: No results found.  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  PLAN:    Stage IVa squamous cell  carcinoma of the parotid gland: CT scan results from March 11, 2023 reviewed independently with a 2.5 cm nodule within the parotid gland.  Biopsy confirmed squamous cell carcinoma.  PET scan results from April 04, 2023 reviewed independently with hypermetabolism in left parotid gland as well as several cervical lymph nodes.  Patient will benefit from concurrent XRT along with weekly cisplatin.  Patient has declined port placement.  Delay cycle 6 of treatment today secondary to neutropenia.  Continue daily XRT completing on June 21, 2023.  Return to clinic in 1 week for further evaluation and reconsideration of cycle 6.  This will likely be his final treatment  of cisplatin.   Hypokalemia: Chronic and unchanged.  Continue oral potassium twice per day.  Patient was also previously given dietary recommendations.   Hypomagnesia: Chronic and unchanged.  Patient's magnesium is 1.4 today.  Continue 4 g IV magnesium along with his treatments. Renal insufficiency: Chronic and unchanged.  Patient creatinine is 1.4 today.  Delay treatment as above. Thrombocytopenia: Resolved. Neutropenia: Patient's ANC is 0.7 today.  Delay treatment as above.  Patient expressed understanding and was in agreement with this plan. He also understands that He can call clinic at any time with any questions, concerns, or complaints.    Cancer Staging  Primary squamous cell carcinoma of parotid gland [facility]) Staging form: Major Salivary Glands, AJCC 8th Edition - Clinical stage from 04/15/2023: Stage IVA (cT2, cN2b, cM0) - Signed by Jeralyn Ruths, MD on 04/15/2023   Jeralyn Ruths, MD   06/05/2023 9:52 AM

## 2023-06-06 ENCOUNTER — Ambulatory Visit
Admission: RE | Admit: 2023-06-06 | Discharge: 2023-06-06 | Disposition: A | Discharge status: Home / Self Care | Payer: Medicare PPO | Source: Ambulatory Visit | Attending: Radiation Oncology | Admitting: Radiation Oncology

## 2023-06-06 ENCOUNTER — Other Ambulatory Visit: Payer: Self-pay

## 2023-06-06 DIAGNOSIS — Z510 Encounter for antineoplastic radiation therapy: Secondary | ICD-10-CM | POA: Diagnosis not present

## 2023-06-06 LAB — RAD ONC ARIA SESSION SUMMARY
Course Elapsed Days: 37
Plan Fractions Treated to Date: 9
Plan Prescribed Dose Per Fraction: 2 Gy
Plan Total Fractions Prescribed: 19
Plan Total Prescribed Dose: 38 Gy
Reference Point Dosage Given to Date: 46 Gy
Reference Point Session Dosage Given: 2 Gy
Session Number: 23

## 2023-06-07 ENCOUNTER — Ambulatory Visit
Admission: RE | Admit: 2023-06-07 | Discharge: 2023-06-07 | Disposition: A | Discharge status: Home / Self Care | Payer: Medicare PPO | Source: Ambulatory Visit | Attending: Radiation Oncology | Admitting: Radiation Oncology

## 2023-06-07 ENCOUNTER — Other Ambulatory Visit: Payer: Self-pay

## 2023-06-07 DIAGNOSIS — Z510 Encounter for antineoplastic radiation therapy: Secondary | ICD-10-CM | POA: Diagnosis not present

## 2023-06-07 LAB — RAD ONC ARIA SESSION SUMMARY
Course Elapsed Days: 38
Plan Fractions Treated to Date: 10
Plan Prescribed Dose Per Fraction: 2 Gy
Plan Total Fractions Prescribed: 19
Plan Total Prescribed Dose: 38 Gy
Reference Point Dosage Given to Date: 48 Gy
Reference Point Session Dosage Given: 2 Gy
Session Number: 24

## 2023-06-10 ENCOUNTER — Ambulatory Visit: Payer: Medicare PPO

## 2023-06-10 ENCOUNTER — Ambulatory Visit
Admission: RE | Admit: 2023-06-10 | Discharge: 2023-06-10 | Disposition: A | Discharge status: Home / Self Care | Payer: Medicare PPO | Source: Ambulatory Visit | Attending: Radiation Oncology | Admitting: Radiation Oncology

## 2023-06-10 ENCOUNTER — Other Ambulatory Visit: Payer: Self-pay

## 2023-06-10 DIAGNOSIS — Z510 Encounter for antineoplastic radiation therapy: Secondary | ICD-10-CM | POA: Diagnosis not present

## 2023-06-10 LAB — RAD ONC ARIA SESSION SUMMARY
Course Elapsed Days: 41
Plan Fractions Treated to Date: 11
Plan Prescribed Dose Per Fraction: 2 Gy
Plan Total Fractions Prescribed: 19
Plan Total Prescribed Dose: 38 Gy
Reference Point Dosage Given to Date: 50 Gy
Reference Point Session Dosage Given: 2 Gy
Session Number: 25

## 2023-06-11 ENCOUNTER — Other Ambulatory Visit: Payer: Self-pay

## 2023-06-11 ENCOUNTER — Ambulatory Visit: Payer: Medicare PPO

## 2023-06-11 ENCOUNTER — Ambulatory Visit
Admission: RE | Admit: 2023-06-11 | Discharge: 2023-06-11 | Disposition: A | Discharge status: Home / Self Care | Payer: Medicare PPO | Source: Ambulatory Visit | Attending: Radiation Oncology | Admitting: Radiation Oncology

## 2023-06-11 DIAGNOSIS — Z510 Encounter for antineoplastic radiation therapy: Secondary | ICD-10-CM | POA: Diagnosis not present

## 2023-06-11 LAB — RAD ONC ARIA SESSION SUMMARY
Course Elapsed Days: 42
Plan Fractions Treated to Date: 12
Plan Prescribed Dose Per Fraction: 2 Gy
Plan Total Fractions Prescribed: 19
Plan Total Prescribed Dose: 38 Gy
Reference Point Dosage Given to Date: 52 Gy
Reference Point Session Dosage Given: 2 Gy
Session Number: 26

## 2023-06-11 MED FILL — Fosaprepitant Dimeglumine For IV Infusion 150 MG (Base Eq): INTRAVENOUS | Qty: 5 | Status: AC

## 2023-06-11 MED FILL — Dexamethasone Sodium Phosphate Inj 100 MG/10ML: INTRAMUSCULAR | Qty: 1 | Status: AC

## 2023-06-12 ENCOUNTER — Inpatient Hospital Stay: Payer: Medicare PPO

## 2023-06-12 ENCOUNTER — Inpatient Hospital Stay (HOSPITAL_BASED_OUTPATIENT_CLINIC_OR_DEPARTMENT_OTHER): Payer: Medicare PPO | Admitting: Medical Oncology

## 2023-06-12 ENCOUNTER — Other Ambulatory Visit: Payer: Self-pay

## 2023-06-12 ENCOUNTER — Encounter: Payer: Self-pay | Admitting: Medical Oncology

## 2023-06-12 ENCOUNTER — Ambulatory Visit
Admission: RE | Admit: 2023-06-12 | Discharge: 2023-06-12 | Disposition: A | Discharge status: Home / Self Care | Payer: Medicare PPO | Source: Ambulatory Visit | Attending: Radiation Oncology | Admitting: Radiation Oncology

## 2023-06-12 DIAGNOSIS — C07 Malignant neoplasm of parotid gland: Secondary | ICD-10-CM

## 2023-06-12 DIAGNOSIS — E876 Hypokalemia: Secondary | ICD-10-CM

## 2023-06-12 DIAGNOSIS — Z510 Encounter for antineoplastic radiation therapy: Secondary | ICD-10-CM | POA: Diagnosis not present

## 2023-06-12 DIAGNOSIS — N289 Disorder of kidney and ureter, unspecified: Secondary | ICD-10-CM | POA: Diagnosis not present

## 2023-06-12 DIAGNOSIS — T451X5A Adverse effect of antineoplastic and immunosuppressive drugs, initial encounter: Secondary | ICD-10-CM

## 2023-06-12 DIAGNOSIS — D701 Agranulocytosis secondary to cancer chemotherapy: Secondary | ICD-10-CM

## 2023-06-12 DIAGNOSIS — Z808 Family history of malignant neoplasm of other organs or systems: Secondary | ICD-10-CM

## 2023-06-12 LAB — MAGNESIUM: Magnesium: 1.3 mg/dL — ABNORMAL LOW (ref 1.7–2.4)

## 2023-06-12 LAB — RAD ONC ARIA SESSION SUMMARY
Course Elapsed Days: 43
Plan Fractions Treated to Date: 13
Plan Prescribed Dose Per Fraction: 2 Gy
Plan Total Fractions Prescribed: 19
Plan Total Prescribed Dose: 38 Gy
Reference Point Dosage Given to Date: 54 Gy
Reference Point Session Dosage Given: 2 Gy
Session Number: 27

## 2023-06-12 LAB — CBC WITH DIFFERENTIAL (CANCER CENTER ONLY)
Abs Immature Granulocytes: 0.04 10*3/uL (ref 0.00–0.07)
Basophils Absolute: 0 10*3/uL (ref 0.0–0.1)
Basophils Relative: 0 %
Eosinophils Absolute: 0 10*3/uL (ref 0.0–0.5)
Eosinophils Relative: 0 %
HCT: 31.7 % — ABNORMAL LOW (ref 39.0–52.0)
Hemoglobin: 11.2 g/dL — ABNORMAL LOW (ref 13.0–17.0)
Immature Granulocytes: 2 %
Lymphocytes Relative: 18 %
Lymphs Abs: 0.4 10*3/uL — ABNORMAL LOW (ref 0.7–4.0)
MCH: 33.3 pg (ref 26.0–34.0)
MCHC: 35.3 g/dL (ref 30.0–36.0)
MCV: 94.3 fL (ref 80.0–100.0)
Monocytes Absolute: 0.4 10*3/uL (ref 0.1–1.0)
Monocytes Relative: 18 %
Neutro Abs: 1.5 10*3/uL — ABNORMAL LOW (ref 1.7–7.7)
Neutrophils Relative %: 62 %
Platelet Count: 273 10*3/uL (ref 150–400)
RBC: 3.36 MIL/uL — ABNORMAL LOW (ref 4.22–5.81)
RDW: 14.4 % (ref 11.5–15.5)
WBC Count: 2.3 10*3/uL — ABNORMAL LOW (ref 4.0–10.5)
nRBC: 0 % (ref 0.0–0.2)

## 2023-06-12 LAB — BASIC METABOLIC PANEL - CANCER CENTER ONLY
Anion gap: 11 (ref 5–15)
BUN: 22 mg/dL (ref 8–23)
CO2: 26 mmol/L (ref 22–32)
Calcium: 8.6 mg/dL — ABNORMAL LOW (ref 8.9–10.3)
Chloride: 99 mmol/L (ref 98–111)
Creatinine: 1.43 mg/dL — ABNORMAL HIGH (ref 0.61–1.24)
GFR, Estimated: 53 mL/min — ABNORMAL LOW (ref >60)
Glucose, Bld: 139 mg/dL — ABNORMAL HIGH (ref 70–99)
Potassium: 3.3 mmol/L — ABNORMAL LOW (ref 3.5–5.1)
Sodium: 136 mmol/L (ref 135–145)

## 2023-06-12 MED ORDER — POTASSIUM CHLORIDE IN NACL 20-0.9 MEQ/L-% IV SOLN
Freq: Once | INTRAVENOUS | Status: AC
  Filled 2023-06-12: qty 1000

## 2023-06-12 MED ORDER — MAGNESIUM SULFATE 4 GM/100ML IV SOLN
4.0000 g | Freq: Once | INTRAVENOUS | Status: AC
  Administered 2023-06-12: 4 g via INTRAVENOUS
  Filled 2023-06-12: qty 100

## 2023-06-12 MED ORDER — SODIUM CHLORIDE 0.9 % IV SOLN
10.0000 mg | Freq: Once | INTRAVENOUS | Status: AC
  Administered 2023-06-12: 10 mg via INTRAVENOUS
  Filled 2023-06-12: qty 10
  Filled 2023-06-12 (×2): qty 1

## 2023-06-12 MED ORDER — SODIUM CHLORIDE 0.9 % IV SOLN
150.0000 mg | Freq: Once | INTRAVENOUS | Status: AC
  Administered 2023-06-12: 150 mg via INTRAVENOUS
  Filled 2023-06-12: qty 150
  Filled 2023-06-12 (×2): qty 5

## 2023-06-12 MED ORDER — SODIUM CHLORIDE 0.9 % IV SOLN
40.0000 mg/m2 | Freq: Once | INTRAVENOUS | Status: AC
  Administered 2023-06-12: 100 mg via INTRAVENOUS
  Filled 2023-06-12: qty 100

## 2023-06-12 MED ORDER — PALONOSETRON HCL INJECTION 0.25 MG/5ML
0.2500 mg | Freq: Once | INTRAVENOUS | Status: AC
  Administered 2023-06-12: 0.25 mg via INTRAVENOUS
  Filled 2023-06-12: qty 5

## 2023-06-12 MED ORDER — SODIUM CHLORIDE 0.9 % IV SOLN
Freq: Once | INTRAVENOUS | Status: AC
  Filled 2023-06-12: qty 250

## 2023-06-12 NOTE — Progress Notes (Signed)
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  INTERVAL HISTORY: Patient returns to clinic today for further evaluation and reconsideration of cycle 6 of weekly cisplatin. He is here with his son. Overall he states that he is doing ok.  He is happy to report that his APAP machine has recorded better and more restful sleep recently. He thinks this is due to the reduction in tumor side along with some neck girth loss. He is ready for his treatment today.    He has no neurologic complaints.  He denies any recent fevers or illnesses.  He has a good appetite and denies weight loss.  He has no chest pain, shortness of breath, cough, or hemoptysis.  He denies any nausea, vomiting, constipation, or diarrhea.  He has no urinary complaints.  Patient offers no further specific complaints today.  REVIEW OF SYSTEMS:   Review of Systems  Constitutional: Negative.  Negative for fever, malaise/fatigue and weight loss.  Respiratory: Negative.  Negative for cough, hemoptysis and shortness of breath.   Cardiovascular: Negative.  Negative for chest pain and leg swelling.  Gastrointestinal: Negative.  Negative for abdominal pain.  Genitourinary: Negative.  Negative for dysuria.  Musculoskeletal: Negative.  Negative for back pain.  Skin: Negative.  Negative for rash.  Neurological: Negative.  Negative for dizziness, focal weakness, weakness and headaches.  Psychiatric/Behavioral: Negative.  The patient is not nervous/anxious.     As per HPI. Otherwise, a complete review of systems is negative.  PAST MEDICAL HISTORY: Past Medical History:  Diagnosis Date   GERD (gastroesophageal reflux disease)    Gout    Hypertension    PVC (premature ventricular contraction)     Sleep apnea    Squamous cell carcinoma of face     PAST SURGICAL HISTORY: Past Surgical History:  Procedure Laterality Date   KNEE SURGERY  1974   PARATHYROIDECTOMY Left 03/29/2020   Procedure: PARATHYROIDECTOMY;  Surgeon: Linus Salmons, MD;  Location: [facility] ORS;  Service: ENT;  Laterality: Left;    FAMILY HISTORY: Family History  Problem Relation Age of Onset   Hypertension Mother    Cancer Mother        skin cancer   Hypertension Father    Cancer Father        skin   Cancer Sister        skin cancer    ADVANCED DIRECTIVES (Y/N):  N  HEALTH MAINTENANCE: Social History   Tobacco Use   Smoking status: Never   Smokeless tobacco: Never  Substance Use Topics   Alcohol use: Yes    Comment: occasional   Drug use: Never   No Known Allergies  Current Outpatient Medications  Medication Sig Dispense Refill   acetaminophen (TYLENOL) 500 MG tablet Take 1,000 mg by mouth every 6 (six) hours as needed for moderate pain or headache.     allopurinol (ZYLOPRIM) 100 MG tablet Take 100 mg by mouth daily.     Carboxymethylcellulose Sodium (ARTIFICIAL TEARS OP) Place 1 drop into both eyes daily as needed (dry eyes).     carvedilol (COREG) 25 MG tablet Take 25 mg by mouth 2 (two) times daily.     chlorthalidone (HYGROTON) 25 MG tablet Take 25 mg by mouth every morning.     colchicine  0.6 MG tablet Take 0.6 mg by mouth 2 (two) times daily as needed (gout flare).      LORazepam (ATIVAN) 0.5 MG tablet Take 0.5 mg by mouth at bedtime as needed for sleep.     losartan (COZAAR) 100 MG tablet Take 100 mg by mouth daily.     Multiple Vitamin (MULTIVITAMIN WITH MINERALS) TABS tablet Take 1 tablet by mouth daily.     naproxen sodium (ALEVE) 220 MG tablet Take 220 mg by mouth 2 (two) times daily as needed (pain).     niacinamide 100 MG tablet Take 100 mg by mouth 2 (two) times daily with a meal.     ofloxacin (OCUFLOX) 0.3 % ophthalmic solution Place 2 drops into both eyes daily as needed  (infection).     omeprazole (PRILOSEC) 20 MG capsule Take 20 mg by mouth daily.     ondansetron (ZOFRAN) 8 MG tablet Take 1 tablet (8 mg total) by mouth every 8 (eight) hours as needed for nausea or vomiting. Start on the third day after cisplatin. 30 tablet 1   potassium chloride SA (KLOR-CON M) 20 MEQ tablet Take 1 tablet (20 mEq total) by mouth 2 (two) times daily. 60 tablet 1   prednisoLONE acetate (PRED FORTE) 1 % ophthalmic suspension Place 1 drop into both eyes daily as needed (allergies).     prochlorperazine (COMPAZINE) 10 MG tablet Take 1 tablet (10 mg total) by mouth every 6 (six) hours as needed (Nausea or vomiting). 30 tablet 1   No current facility-administered medications for this visit.    OBJECTIVE: Vitals:   06/12/23 0913  BP: (!) 158/88  Pulse: 67  Temp: 98.2 F (36.8 C)    Body mass index is 36.81 kg/m.    ECOG FS:0 - Asymptomatic  General: Well-developed, well-nourished, no acute distress. Eyes: Pink conjunctiva, anicteric sclera. HEENT: Normocephalic, moist mucous membranes.  No palpable lymphadenopathy. Lungs: No audible wheezing or coughing. Heart: Regular rate and rhythm. Abdomen: Soft, nontender, no obvious distention. Musculoskeletal: No edema, cyanosis, or clubbing. Neuro: Alert, answering all questions appropriately. Cranial nerves grossly intact. Skin: No rashes or petechiae noted. Psych: Normal affect.  LAB RESULTS:  Lab Results  Component Value Date   NA 136 06/12/2023   K 3.3 (L) 06/12/2023   CL 99 06/12/2023   CO2 26 06/12/2023   GLUCOSE 139 (H) 06/12/2023   BUN 22 06/12/2023   CREATININE 1.43 (H) 06/12/2023   CALCIUM 8.6 (L) 06/12/2023   GFRNONAA 53 (L) 06/12/2023   GFRAA >60 03/29/2020    Lab Results  Component Value Date   WBC 2.3 (L) 06/12/2023   NEUTROABS 1.5 (L) 06/12/2023   HGB 11.2 (L) 06/12/2023   HCT 31.7 (L) 06/12/2023   MCV 94.3 06/12/2023   PLT 273 06/12/2023   STUDIES: No results found.  ASSESSMENT: Stage IVa  squamous cell carcinoma of the parotid gland.  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  Biopsy confirmed squamous cell carcinoma.  PET scan results from April 04, 2023 reviewed independently with hypermetabolism in left parotid gland as well as several cervical lymph nodes.  Patient will benefit from concurrent XRT along with weekly cisplatin. He is here for further evaluation and reconsideration of cycle 6.  This will likely be his final treatment of cisplatin.  Labs reviewed and acceptable for treatment.  Hypokalemia: Chronic and unchanged. K  of 3.3. Continue oral potassium twice per day.  Patient was also previously given dietary recommendations.   Hypomagnesia: Chronic and essentially unchanged.  Patient's magnesium is 1.3 today.  Continue 4 g IV magnesium along with his treatments. Renal insufficiency: Chronic . Creatinine of 1.43 today. The last two creatinines have been 1.4 and 1.38 over the past 2 weeks. Within treatment parameters. Saline to help as well.  Thrombocytopenia: Resolved. Platelets are 273 today.  Neutropenia: Patient's WBC is 2.3 today with ANC of 1.5. Asked on call physician about Greggory Keen support however given patient's concurrent XRT this is not recommended.    Disposition: Cycle 6 today RTC 06/19/2023 as planned.    Patient expressed understanding and was in agreement with this plan. He also understands that He can call clinic at any time with any questions, concerns, or complaints.    Cancer Staging  Primary squamous cell carcinoma of parotid gland [facility]) Staging form: Major Salivary Glands, AJCC 8th Edition - Clinical stage from 04/15/2023: Stage IVA (cT2, cN2b, cM0) - Signed by Jeralyn Ruths, MD on 04/15/2023   Rushie Chestnut, PA-C   06/12/2023 3:32 PM

## 2023-06-12 NOTE — Progress Notes (Signed)
Nutrition Follow-up:  Patient with stage IV SCC of parotid gland.  Receiving cisplatin and radiation.  Planning last chemo on 7/3 and last radiation on 7/5  Met with patient and son today during infusion.  Patient reports taste came back slightly after not having treatment for couple weeks.  Able to taste pizza last night for dinner.  Breakfast has been bananas, fig newtons.  Later will have 2-3 eggs, bacon.  Drinks shakes at times.  Ice cream sandwiches taste normal.  Able to eat hamburger, chicken, ACP from [place] frozen brussel sprouts and broccoli.  Salsa burned along with any seasoning (including salt).      Medications: reviewed  Labs: Na 128, glucose 103  Anthropometrics:   Weight 279 lb today  281 lb 14.4 oz on 6/19 290 lb on 6/5 294 lb on 5/22 296 lb on 5/8 292 lb on 9/11   NUTRITION DIAGNOSIS: Predicted sub optimal energy intake stable    INTERVENTION:  Recommend drinking oral nutrition supplement daily even if eating solid foods for additional calories and protein     MONITORING, EVALUATION, GOAL: weight trends, intake   NEXT VISIT: phone call  Thursday, July 18  Dedee Liss B. Freida Busman, RD, LDN Registered Dietitian [phone]

## 2023-06-12 NOTE — Progress Notes (Signed)
Douglas Copas, MD Okay to run post fluids during treatment for Cisplatin.

## 2023-06-13 ENCOUNTER — Ambulatory Visit: Payer: Medicare PPO

## 2023-06-13 ENCOUNTER — Other Ambulatory Visit: Payer: Self-pay

## 2023-06-13 ENCOUNTER — Ambulatory Visit
Admission: RE | Admit: 2023-06-13 | Discharge: 2023-06-13 | Disposition: A | Discharge status: Home / Self Care | Payer: Medicare PPO | Source: Ambulatory Visit | Attending: Radiation Oncology | Admitting: Radiation Oncology

## 2023-06-13 DIAGNOSIS — Z510 Encounter for antineoplastic radiation therapy: Secondary | ICD-10-CM | POA: Diagnosis not present

## 2023-06-13 LAB — RAD ONC ARIA SESSION SUMMARY
Course Elapsed Days: 44
Plan Fractions Treated to Date: 14
Plan Prescribed Dose Per Fraction: 2 Gy
Plan Total Fractions Prescribed: 19
Plan Total Prescribed Dose: 38 Gy
Reference Point Dosage Given to Date: 56 Gy
Reference Point Session Dosage Given: 2 Gy
Session Number: 28

## 2023-06-14 ENCOUNTER — Ambulatory Visit: Payer: Medicare PPO

## 2023-06-14 ENCOUNTER — Ambulatory Visit
Admission: RE | Admit: 2023-06-14 | Discharge: 2023-06-14 | Disposition: A | Discharge status: Home / Self Care | Payer: Medicare PPO | Source: Ambulatory Visit | Attending: Radiation Oncology | Admitting: Radiation Oncology

## 2023-06-14 ENCOUNTER — Other Ambulatory Visit: Payer: Self-pay

## 2023-06-14 DIAGNOSIS — Z510 Encounter for antineoplastic radiation therapy: Secondary | ICD-10-CM | POA: Diagnosis not present

## 2023-06-14 LAB — RAD ONC ARIA SESSION SUMMARY
Course Elapsed Days: 45
Plan Fractions Treated to Date: 15
Plan Prescribed Dose Per Fraction: 2 Gy
Plan Total Fractions Prescribed: 19
Plan Total Prescribed Dose: 38 Gy
Reference Point Dosage Given to Date: 58 Gy
Reference Point Session Dosage Given: 2 Gy
Session Number: 29

## 2023-06-15 ENCOUNTER — Other Ambulatory Visit: Payer: Self-pay

## 2023-06-17 ENCOUNTER — Other Ambulatory Visit: Payer: Self-pay

## 2023-06-17 ENCOUNTER — Ambulatory Visit
Admission: RE | Admit: 2023-06-17 | Discharge: 2023-06-17 | Disposition: A | Discharge status: Home / Self Care | Payer: Medicare PPO | Source: Ambulatory Visit | Attending: Radiation Oncology | Admitting: Radiation Oncology

## 2023-06-17 DIAGNOSIS — C07 Malignant neoplasm of parotid gland: Secondary | ICD-10-CM | POA: Diagnosis not present

## 2023-06-17 DIAGNOSIS — E876 Hypokalemia: Secondary | ICD-10-CM | POA: Diagnosis not present

## 2023-06-17 DIAGNOSIS — Z510 Encounter for antineoplastic radiation therapy: Secondary | ICD-10-CM | POA: Diagnosis not present

## 2023-06-17 DIAGNOSIS — Z808 Family history of malignant neoplasm of other organs or systems: Secondary | ICD-10-CM | POA: Insufficient documentation

## 2023-06-17 DIAGNOSIS — Z5111 Encounter for antineoplastic chemotherapy: Secondary | ICD-10-CM | POA: Diagnosis present

## 2023-06-17 LAB — RAD ONC ARIA SESSION SUMMARY
Course Elapsed Days: 48
Plan Fractions Treated to Date: 16
Plan Prescribed Dose Per Fraction: 2 Gy
Plan Total Fractions Prescribed: 19
Plan Total Prescribed Dose: 38 Gy
Reference Point Dosage Given to Date: 60 Gy
Reference Point Session Dosage Given: 2 Gy
Session Number: 30

## 2023-06-18 ENCOUNTER — Ambulatory Visit
Admission: RE | Admit: 2023-06-18 | Discharge: 2023-06-18 | Disposition: A | Discharge status: Home / Self Care | Payer: Medicare PPO | Source: Ambulatory Visit | Attending: Radiation Oncology | Admitting: Radiation Oncology

## 2023-06-18 ENCOUNTER — Other Ambulatory Visit: Payer: Self-pay

## 2023-06-18 DIAGNOSIS — Z510 Encounter for antineoplastic radiation therapy: Secondary | ICD-10-CM | POA: Diagnosis not present

## 2023-06-18 LAB — RAD ONC ARIA SESSION SUMMARY
Course Elapsed Days: 49
Plan Fractions Treated to Date: 17
Plan Prescribed Dose Per Fraction: 2 Gy
Plan Total Fractions Prescribed: 19
Plan Total Prescribed Dose: 38 Gy
Reference Point Dosage Given to Date: 62 Gy
Reference Point Session Dosage Given: 2 Gy
Session Number: 31

## 2023-06-18 MED FILL — Dexamethasone Sodium Phosphate Inj 100 MG/10ML: INTRAMUSCULAR | Qty: 1 | Status: AC

## 2023-06-18 MED FILL — Fosaprepitant Dimeglumine For IV Infusion 150 MG (Base Eq): INTRAVENOUS | Qty: 5 | Status: AC

## 2023-06-19 ENCOUNTER — Other Ambulatory Visit: Payer: Self-pay

## 2023-06-19 ENCOUNTER — Inpatient Hospital Stay: Payer: Medicare PPO

## 2023-06-19 ENCOUNTER — Ambulatory Visit
Admission: RE | Admit: 2023-06-19 | Discharge: 2023-06-19 | Disposition: A | Discharge status: Home / Self Care | Payer: Medicare PPO | Source: Ambulatory Visit | Attending: Radiation Oncology | Admitting: Radiation Oncology

## 2023-06-19 ENCOUNTER — Encounter: Payer: Self-pay | Admitting: Oncology

## 2023-06-19 ENCOUNTER — Inpatient Hospital Stay (HOSPITAL_BASED_OUTPATIENT_CLINIC_OR_DEPARTMENT_OTHER): Payer: Medicare PPO | Admitting: Oncology

## 2023-06-19 DIAGNOSIS — C07 Malignant neoplasm of parotid gland: Secondary | ICD-10-CM | POA: Insufficient documentation

## 2023-06-19 DIAGNOSIS — Z510 Encounter for antineoplastic radiation therapy: Secondary | ICD-10-CM | POA: Diagnosis not present

## 2023-06-19 DIAGNOSIS — N289 Disorder of kidney and ureter, unspecified: Secondary | ICD-10-CM | POA: Insufficient documentation

## 2023-06-19 DIAGNOSIS — E876 Hypokalemia: Secondary | ICD-10-CM | POA: Insufficient documentation

## 2023-06-19 LAB — MAGNESIUM: Magnesium: 1.2 mg/dL — ABNORMAL LOW (ref 1.7–2.4)

## 2023-06-19 LAB — RAD ONC ARIA SESSION SUMMARY
Course Elapsed Days: 50
Plan Fractions Treated to Date: 18
Plan Prescribed Dose Per Fraction: 2 Gy
Plan Total Fractions Prescribed: 19
Plan Total Prescribed Dose: 38 Gy
Reference Point Dosage Given to Date: 64 Gy
Reference Point Session Dosage Given: 2 Gy
Session Number: 32

## 2023-06-19 LAB — CBC WITH DIFFERENTIAL (CANCER CENTER ONLY)
Abs Immature Granulocytes: 0.2 10*3/uL — ABNORMAL HIGH (ref 0.00–0.07)
Basophils Absolute: 0 10*3/uL (ref 0.0–0.1)
Basophils Relative: 1 %
Eosinophils Absolute: 0 10*3/uL (ref 0.0–0.5)
Eosinophils Relative: 0 %
HCT: 30.4 % — ABNORMAL LOW (ref 39.0–52.0)
Hemoglobin: 10.9 g/dL — ABNORMAL LOW (ref 13.0–17.0)
Immature Granulocytes: 3 %
Lymphocytes Relative: 8 %
Lymphs Abs: 0.5 10*3/uL — ABNORMAL LOW (ref 0.7–4.0)
MCH: 34 pg (ref 26.0–34.0)
MCHC: 35.9 g/dL (ref 30.0–36.0)
MCV: 94.7 fL (ref 80.0–100.0)
Monocytes Absolute: 0.7 10*3/uL (ref 0.1–1.0)
Monocytes Relative: 12 %
Neutro Abs: 4.8 10*3/uL (ref 1.7–7.7)
Neutrophils Relative %: 76 %
Platelet Count: 343 10*3/uL (ref 150–400)
RBC: 3.21 MIL/uL — ABNORMAL LOW (ref 4.22–5.81)
RDW: 15.4 % (ref 11.5–15.5)
WBC Count: 6.3 10*3/uL (ref 4.0–10.5)
nRBC: 0 % (ref 0.0–0.2)

## 2023-06-19 LAB — BASIC METABOLIC PANEL - CANCER CENTER ONLY
Anion gap: 12 (ref 5–15)
BUN: 26 mg/dL — ABNORMAL HIGH (ref 8–23)
CO2: 24 mmol/L (ref 22–32)
Calcium: 8.3 mg/dL — ABNORMAL LOW (ref 8.9–10.3)
Chloride: 96 mmol/L — ABNORMAL LOW (ref 98–111)
Creatinine: 1.7 mg/dL — ABNORMAL HIGH (ref 0.61–1.24)
GFR, Estimated: 43 mL/min — ABNORMAL LOW (ref >60)
Glucose, Bld: 155 mg/dL — ABNORMAL HIGH (ref 70–99)
Potassium: 3.4 mmol/L — ABNORMAL LOW (ref 3.5–5.1)
Sodium: 132 mmol/L — ABNORMAL LOW (ref 135–145)

## 2023-06-19 MED ORDER — SODIUM CHLORIDE 0.9 % IV SOLN
Freq: Once | INTRAVENOUS | Status: AC
  Filled 2023-06-19: qty 250

## 2023-06-19 MED ORDER — MAGNESIUM SULFATE 4 GM/100ML IV SOLN
4.0000 g | Freq: Once | INTRAVENOUS | Status: AC
  Administered 2023-06-19: 4 g via INTRAVENOUS
  Filled 2023-06-19: qty 100

## 2023-06-19 NOTE — Patient Instructions (Addendum)
Hypomagnesemia Hypomagnesemia is a condition in which the level of magnesium in the blood is too low. Magnesium is a mineral that is found in many foods. It is used in many different processes in the body. Hypomagnesemia can affect every organ in the body. In severe cases, it can cause life-threatening problems. What are the causes? This condition may be caused by: Not getting enough magnesium in your diet or not having enough healthy foods to eat (malnutrition). Problems with magnesium absorption in the intestines. Dehydration. Excessive use of alcohol. Vomiting. Severe or long-term (chronic) diarrhea. Some medicines, including medicines that make you urinate more often (diuretics). Certain diseases, such as kidney disease, diabetes, celiac disease, and overactive thyroid. What are the signs or symptoms? Symptoms of this condition include: Loss of appetite, nausea, and vomiting. Involuntary shaking or trembling of a body part (tremor). Muscle weakness or tingling in the arms and legs. Sudden tightening of muscles (muscle spasms). Confusion. Psychiatric issues, such as: Depression and irritability. Psychosis. A feeling of fluttering of the heart (palpitations). Seizures. These symptoms are more severe if magnesium levels drop suddenly. How is this diagnosed? This condition may be diagnosed based on: Your symptoms and medical history. A physical exam. Blood and urine tests. How is this treated? Treatment depends on the cause and the severity of the condition. It may be treated by: Taking a magnesium supplement. This can be taken in pill form. If the condition is severe, magnesium is usually given through an IV. Making changes to your diet. You may be directed to eat foods that have a lot of magnesium, such as green leafy vegetables, peas, beans, and nuts. Not drinking alcohol. If you are struggling not to drink, ask your health care provider for help. Follow these instructions at  home: Eating and drinking     Make sure that your diet includes foods with magnesium. Foods that have a lot of magnesium in them include: Green leafy vegetables, such as spinach and broccoli. Beans and peas. Nuts and seeds, such as almonds and sunflower seeds. Whole grains, such as whole grain bread and fortified cereals. Drink fluids that contain salts and minerals (electrolytes), such as sports drinks, when you are active. Do not drink alcohol. General instructions Take over-the-counter and prescription medicines only as told by your health care provider. Take magnesium supplements as directed if your health care provider tells you to take them. Have your magnesium levels monitored as told by your health care provider. Keep all follow-up visits. This is important. Contact a health care provider if: You get worse instead of better. Your symptoms return. Get help right away if: You develop severe muscle weakness. You have trouble breathing. You feel that your heart is racing. These symptoms may represent a serious problem that is an emergency. Do not wait to see if the symptoms will go away. Get medical help right away. Call your local emergency services (911 in the [place] Do not drive yourself to the hospital. Summary Hypomagnesemia is a condition in which the level of magnesium in the blood is too low. Hypomagnesemia can affect every organ in the body. Treatment may include eating more foods that contain magnesium, taking magnesium supplements, and not drinking alcohol. Have your magnesium levels monitored as told by your health care provider. This information is not intended to replace advice given to you by your health care provider. Make sure you discuss any questions you have with your health care provider. Document Revised: 05/02/2021 Document Reviewed: 05/02/2021 Elsevier Patient Education    2024 Elsevier Inc.  

## 2023-06-19 NOTE — Progress Notes (Signed)
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  INTERVAL HISTORY: Patient returns to clinic today for further evaluation and consideration of cycle 7 of weekly cisplatin.  He continues to have a mild dysphagia, but otherwise feels well and is tolerating his treatments without significant side effects.  He has no neurologic complaints.  He denies any recent fevers or illnesses.  He has a fair appetite and denies weight loss.  He has no chest pain, shortness of breath, cough, or hemoptysis.  He denies any nausea, vomiting, constipation, or diarrhea.  He has no urinary complaints.  Patient offers no further specific complaints today.  REVIEW OF SYSTEMS:   Review of Systems  Constitutional: Negative.  Negative for fever, malaise/fatigue and weight loss.  Respiratory: Negative.  Negative for cough, hemoptysis and shortness of breath.   Cardiovascular: Negative.  Negative for chest pain and leg swelling.  Gastrointestinal: Negative.  Negative for abdominal pain.  Genitourinary: Negative.  Negative for dysuria.  Musculoskeletal: Negative.  Negative for back pain.  Skin: Negative.  Negative for rash.  Neurological: Negative.  Negative for dizziness, focal weakness, weakness and headaches.  Psychiatric/Behavioral: Negative.  The patient is not nervous/anxious.     As per HPI. Otherwise, a complete review of systems is negative.  PAST MEDICAL HISTORY: Past Medical History:  Diagnosis Date   GERD (gastroesophageal reflux disease)    Gout    Hypertension    PVC (premature ventricular contraction)    Sleep apnea    Squamous cell carcinoma of face     PAST SURGICAL HISTORY: Past Surgical History:  Procedure Laterality Date   KNEE SURGERY  1974    PARATHYROIDECTOMY Left 03/29/2020   Procedure: PARATHYROIDECTOMY;  Surgeon: Linus Salmons, MD;  Location: [facility] ORS;  Service: ENT;  Laterality: Left;    FAMILY HISTORY: Family History  Problem Relation Age of Onset   Hypertension Mother    Cancer Mother        skin cancer   Hypertension Father    Cancer Father        skin   Cancer Sister        skin cancer    ADVANCED DIRECTIVES (Y/N):  N  HEALTH MAINTENANCE: Social History   Tobacco Use   Smoking status: Never   Smokeless tobacco: Never  Substance Use Topics   Alcohol use: Yes    Comment: occasional   Drug use: Never     Colonoscopy:  PAP:  Bone density:  Lipid panel:  No Known Allergies  Current Outpatient Medications  Medication Sig Dispense Refill   acetaminophen (TYLENOL) 500 MG tablet Take 1,000 mg by mouth every 6 (six) hours as needed for moderate pain or headache.     allopurinol (ZYLOPRIM) 100 MG tablet Take 100 mg by mouth daily.     Carboxymethylcellulose Sodium (ARTIFICIAL TEARS OP) Place 1 drop into both eyes daily as needed (dry eyes).     carvedilol (COREG) 25 MG tablet Take 25 mg by mouth 2 (two) times daily.     chlorthalidone (HYGROTON) 25 MG tablet Take 25 mg by mouth every morning.     colchicine 0.6 MG tablet Take 0.6 mg by mouth 2 (two) times daily as needed (gout flare).      LORazepam (ATIVAN) 0.5 MG  tablet Take 0.5 mg by mouth at bedtime as needed for sleep.     losartan (COZAAR) 100 MG tablet Take 100 mg by mouth daily.     Multiple Vitamin (MULTIVITAMIN WITH MINERALS) TABS tablet Take 1 tablet by mouth daily.     naproxen sodium (ALEVE) 220 MG tablet Take 220 mg by mouth 2 (two) times daily as needed (pain).     niacinamide 100 MG tablet Take 100 mg by mouth 2 (two) times daily with a meal.     ofloxacin (OCUFLOX) 0.3 % ophthalmic solution Place 2 drops into both eyes daily as needed (infection).     omeprazole (PRILOSEC) 20 MG capsule Take 20 mg by mouth daily.     ondansetron  (ZOFRAN) 8 MG tablet Take 1 tablet (8 mg total) by mouth every 8 (eight) hours as needed for nausea or vomiting. Start on the third day after cisplatin. 30 tablet 1   potassium chloride SA (KLOR-CON M) 20 MEQ tablet Take 1 tablet (20 mEq total) by mouth 2 (two) times daily. 60 tablet 1   prednisoLONE acetate (PRED FORTE) 1 % ophthalmic suspension Place 1 drop into both eyes daily as needed (allergies).     prochlorperazine (COMPAZINE) 10 MG tablet Take 1 tablet (10 mg total) by mouth every 6 (six) hours as needed (Nausea or vomiting). 30 tablet 1   No current facility-administered medications for this visit.    OBJECTIVE: Vitals:   06/19/23 0838  BP: 126/78  Pulse: 72  Resp: 16  Temp: 98.8 F (37.1 C)  SpO2: 100%       Body mass index is 36.41 kg/m.    ECOG FS:0 - Asymptomatic  General: Well-developed, well-nourished, no acute distress. Eyes: Pink conjunctiva, anicteric sclera. HEENT: Normocephalic, moist mucous membranes.  No palpable lymphadenopathy. Lungs: No audible wheezing or coughing. Heart: Regular rate and rhythm. Abdomen: Soft, nontender, no obvious distention. Musculoskeletal: No edema, cyanosis, or clubbing. Neuro: Alert, answering all questions appropriately. Cranial nerves grossly intact. Skin: No rashes or petechiae noted. Psych: Normal affect.  LAB RESULTS:  Lab Results  Component Value Date   NA 132 (L) 06/19/2023   K 3.4 (L) 06/19/2023   CL 96 (L) 06/19/2023   CO2 24 06/19/2023   GLUCOSE 155 (H) 06/19/2023   BUN 26 (H) 06/19/2023   CREATININE 1.70 (H) 06/19/2023   CALCIUM 8.3 (L) 06/19/2023   GFRNONAA 43 (L) 06/19/2023   GFRAA >60 03/29/2020    Lab Results  Component Value Date   WBC 6.3 06/19/2023   NEUTROABS 4.8 06/19/2023   HGB 10.9 (L) 06/19/2023   HCT 30.4 (L) 06/19/2023   MCV 94.7 06/19/2023   PLT 343 06/19/2023     STUDIES: No results found.  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  PLAN:    Stage  IVa squamous cell carcinoma of the parotid gland: CT scan results from March 11, 2023 reviewed independently with a 2.5 cm nodule within the parotid gland.  Biopsy confirmed squamous cell carcinoma.  PET scan results from April 04, 2023 reviewed independently with hypermetabolism in left parotid gland as well as several cervical lymph nodes.  Patient will benefit from concurrent XRT along with weekly cisplatin.  Patient has declined port placement.  Patient received his sixth and final cycle of cisplatin on June 12, 2023.  XRT will be completed on June 21, 2023.  Cisplatin has been discontinued at this time secondary to rising creatinine.  Patient instead will receive IV fluids  and IV magnesium today.  Return to clinic in 1 month with repeat laboratory work and further evaluation.  Will reimage with PET scan in October 2024. Hypokalemia: Chronic and unchanged.  Continue oral potassium twice per day.  Patient was also previously given dietary recommendations.   Hypomagnesia: Magnesium is 1.2 today.  Proceed with 4 g IV magnesium. Renal insufficiency: Creatinine is trended up to 1.7.  Discontinue cisplatin as above.   Thrombocytopenia: Resolved. Neutropenia: Resolved.  Patient expressed understanding and was in agreement with this plan. He also understands that He can call clinic at any time with any questions, concerns, or complaints.    Cancer Staging  Primary squamous cell carcinoma of parotid gland [facility]) Staging form: Major Salivary Glands, AJCC 8th Edition - Clinical stage from 04/15/2023: Stage IVA (cT2, cN2b, cM0) - Signed by Jeralyn Ruths, MD on 04/15/2023   Jeralyn Ruths, MD   06/19/2023 12:27 PM

## 2023-06-20 ENCOUNTER — Other Ambulatory Visit: Payer: Self-pay

## 2023-06-21 ENCOUNTER — Ambulatory Visit: Payer: Medicare PPO

## 2023-06-21 ENCOUNTER — Ambulatory Visit
Admission: RE | Admit: 2023-06-21 | Discharge: 2023-06-21 | Disposition: A | Discharge status: Home / Self Care | Payer: Medicare PPO | Source: Ambulatory Visit | Attending: Radiation Oncology | Admitting: Radiation Oncology

## 2023-06-21 ENCOUNTER — Other Ambulatory Visit: Payer: Self-pay

## 2023-06-21 DIAGNOSIS — Z510 Encounter for antineoplastic radiation therapy: Secondary | ICD-10-CM | POA: Diagnosis not present

## 2023-06-21 LAB — RAD ONC ARIA SESSION SUMMARY
Course Elapsed Days: 52
Plan Fractions Treated to Date: 19
Plan Prescribed Dose Per Fraction: 2 Gy
Plan Total Fractions Prescribed: 19
Plan Total Prescribed Dose: 38 Gy
Reference Point Dosage Given to Date: 66 Gy
Reference Point Session Dosage Given: 2 Gy
Session Number: 33

## 2023-07-04 ENCOUNTER — Inpatient Hospital Stay: Payer: Medicare PPO

## 2023-07-04 NOTE — Progress Notes (Signed)
Nutrition Follow-up:  Patient with stage IV SCC of parotid gland.  Completed radiation on 7/5 and last chemotherapy on 6/26.  Last cycle of chemo held.  Spoke with patient via phone.  Reports that appetite is coming back along with taste.  Has been able to add ketchup to hamburger without burning mouth recently.  Saliva in mouth is less.  Pasta taste fairly decent.  Mornings he has been eating eggs, oatmeal, banana.  Sore throat has improved and mouth sores/pain is better.      Medications: reviewed  Labs: reviewed  Anthropometrics:   Weight 276 lb on 7/3  279 lb on 6/26 281 lb 14.4 oz on 6/19 290 lb on 6/5 294 lb on 5/22 296 lb on 5/8 292 lb on 9/11  6% weight loss in the last 2 months, concerning  Patient please with weight loss and blood pressure has improved   NUTRITION DIAGNOSIS: Predicted sub optimal energy intake stable    INTERVENTION:  Encouraged continued intake of foods high in protein Patient to reach out to RD if needed in the future Patient has contact information    NEXT VISIT: no follow-up Patient to contact RD if needed  Douglas Daugherty B. Freida Busman, RD, LDN Registered Dietitian [phone]

## 2023-07-25 ENCOUNTER — Ambulatory Visit
Admission: RE | Admit: 2023-07-25 | Discharge: 2023-07-25 | Disposition: A | Discharge status: Home / Self Care | Payer: Medicare PPO | Source: Ambulatory Visit | Attending: Radiation Oncology | Admitting: Radiation Oncology

## 2023-07-25 ENCOUNTER — Encounter: Payer: Self-pay | Admitting: Radiation Oncology

## 2023-07-25 VITALS — BP 142/78 | HR 70 | Temp 97.0°F | Resp 18 | Ht 73.0 in | Wt 271.0 lb

## 2023-07-25 DIAGNOSIS — K118 Other diseases of salivary glands: Secondary | ICD-10-CM | POA: Diagnosis present

## 2023-07-25 NOTE — Progress Notes (Signed)
Radiation Oncology Follow up Note  Name: Douglas Daugherty   Date:   07/25/2023 MRN:  [id number] DOB: 1956/03/21    This 67 y.o. male presents to the clinic today for 1 month follow-up status post radiation therapy to the left parotid and bilateral neck for locally advanced squamous cell carcinoma most likely of skin origin status post concurrent chemoradiation.  REFERRING PROVIDER: Jaclyn Shaggy, MD  HPI: Patient is a 67 year old male now at 1 month having completed concurrent chemoradiation therapy for local advanced squamous cell carcinoma involving the left parotid as well as bilateral neck nodes.  Seen today in routine follow-up he is doing well.  Specifically denies head and neck pain or dysphagia..  Patient's taste is returning.  He has not yet been back to ENT.  COMPLICATIONS OF TREATMENT: none  FOLLOW UP COMPLIANCE: keeps appointments   PHYSICAL EXAM:  BP (!) 142/78   Pulse 70   Temp (!) 97 F (36.1 C)   Resp 18   Ht 6\' 1"  (1.854 m)   Wt 271 lb (122.9 kg)   BMI 35.75 kg/m  Oral cavity is clear no oral mucosal lesions identified.  Neck is clear without evidence of gross adenopathy.  Parotid beds are without mass or nodularity.  Well-developed well-nourished patient in NAD. HEENT reveals PERLA, EOMI, discs not visualized.  Oral cavity is clear. No oral mucosal lesions are identified. Neck is clear without evidence of cervical or supraclavicular adenopathy. Lungs are clear to A&P. Cardiac examination is essentially unremarkable with regular rate and rhythm without murmur rub or thrill. Abdomen is benign with no organomegaly or masses noted. Motor sensory and DTR levels are equal and symmetric in the upper and lower extremities. Cranial nerves II through XII are grossly intact. Proprioception is intact. No peripheral adenopathy or edema is identified. No motor or sensory levels are noted. Crude visual fields are within normal range.  RADIOLOGY RESULTS: No current films for  review  PLAN: Present time patient is doing well 1 month out from concurrent chemoradiation therapy.  And pleased with his overall progress.  I have asked to see him back in 3 to 4 months.  Dr. Orlie Dakin will be ordering a PET scan I will review that at that time.  Patient knows to call with any concerns.  I have asked him to make follow-up appointments with ENT.  I would like to take this opportunity to thank you for allowing me to participate in the care of your patient.[name], MD

## 2023-07-26 ENCOUNTER — Other Ambulatory Visit: Payer: Self-pay

## 2023-08-06 ENCOUNTER — Other Ambulatory Visit: Payer: Self-pay

## 2023-08-06 DIAGNOSIS — C07 Malignant neoplasm of parotid gland: Secondary | ICD-10-CM

## 2023-08-07 ENCOUNTER — Inpatient Hospital Stay: Payer: Medicare PPO | Admitting: Oncology

## 2023-08-07 ENCOUNTER — Inpatient Hospital Stay: Payer: Medicare PPO

## 2023-08-07 ENCOUNTER — Encounter: Payer: Self-pay | Admitting: Oncology

## 2023-08-07 ENCOUNTER — Inpatient Hospital Stay: Payer: Medicare PPO | Attending: Oncology

## 2023-08-07 VITALS — BP 167/91 | HR 64 | Temp 97.9°F | Resp 18 | Ht 73.0 in | Wt 269.0 lb

## 2023-08-07 DIAGNOSIS — Z923 Personal history of irradiation: Secondary | ICD-10-CM | POA: Diagnosis not present

## 2023-08-07 DIAGNOSIS — C07 Malignant neoplasm of parotid gland: Secondary | ICD-10-CM | POA: Diagnosis present

## 2023-08-07 DIAGNOSIS — Z9221 Personal history of antineoplastic chemotherapy: Secondary | ICD-10-CM | POA: Insufficient documentation

## 2023-08-07 DIAGNOSIS — N289 Disorder of kidney and ureter, unspecified: Secondary | ICD-10-CM | POA: Diagnosis not present

## 2023-08-07 DIAGNOSIS — E876 Hypokalemia: Secondary | ICD-10-CM | POA: Insufficient documentation

## 2023-08-07 DIAGNOSIS — Z808 Family history of malignant neoplasm of other organs or systems: Secondary | ICD-10-CM | POA: Insufficient documentation

## 2023-08-07 DIAGNOSIS — D649 Anemia, unspecified: Secondary | ICD-10-CM | POA: Insufficient documentation

## 2023-08-07 LAB — BASIC METABOLIC PANEL - CANCER CENTER ONLY
Anion gap: 9 (ref 5–15)
BUN: 14 mg/dL (ref 8–23)
CO2: 27 mmol/L (ref 22–32)
Calcium: 8.5 mg/dL — ABNORMAL LOW (ref 8.9–10.3)
Chloride: 100 mmol/L (ref 98–111)
Creatinine: 1.33 mg/dL — ABNORMAL HIGH (ref 0.61–1.24)
GFR, Estimated: 58 mL/min — ABNORMAL LOW (ref >60)
Glucose, Bld: 135 mg/dL — ABNORMAL HIGH (ref 70–99)
Potassium: 3.2 mmol/L — ABNORMAL LOW (ref 3.5–5.1)
Sodium: 136 mmol/L (ref 135–145)

## 2023-08-07 LAB — CBC WITH DIFFERENTIAL (CANCER CENTER ONLY)
Abs Immature Granulocytes: 0.02 10*3/uL (ref 0.00–0.07)
Basophils Absolute: 0 10*3/uL (ref 0.0–0.1)
Basophils Relative: 0 %
Eosinophils Absolute: 0.2 10*3/uL (ref 0.0–0.5)
Eosinophils Relative: 3 %
HCT: 36.7 % — ABNORMAL LOW (ref 39.0–52.0)
Hemoglobin: 12.3 g/dL — ABNORMAL LOW (ref 13.0–17.0)
Immature Granulocytes: 0 %
Lymphocytes Relative: 9 %
Lymphs Abs: 0.5 10*3/uL — ABNORMAL LOW (ref 0.7–4.0)
MCH: 35.3 pg — ABNORMAL HIGH (ref 26.0–34.0)
MCHC: 33.5 g/dL (ref 30.0–36.0)
MCV: 105.5 fL — ABNORMAL HIGH (ref 80.0–100.0)
Monocytes Absolute: 0.6 10*3/uL (ref 0.1–1.0)
Monocytes Relative: 9 %
Neutro Abs: 4.8 10*3/uL (ref 1.7–7.7)
Neutrophils Relative %: 79 %
Platelet Count: 195 10*3/uL (ref 150–400)
RBC: 3.48 MIL/uL — ABNORMAL LOW (ref 4.22–5.81)
RDW: 13.8 % (ref 11.5–15.5)
WBC Count: 6.1 10*3/uL (ref 4.0–10.5)
nRBC: 0 % (ref 0.0–0.2)

## 2023-08-07 LAB — MAGNESIUM: Magnesium: 1.6 mg/dL — ABNORMAL LOW (ref 1.7–2.4)

## 2023-08-07 NOTE — Progress Notes (Unsigned)
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  INTERVAL HISTORY: Patient returns to clinic today for further evaluation and laboratory work.  His dysphagia has improved since completing his treatments.  He currently feels well.  He has no neurologic complaints.  He denies any recent fevers or illnesses.  He has a fair appetite and denies weight loss.  He has no chest pain, shortness of breath, cough, or hemoptysis.  He denies any nausea, vomiting, constipation, or diarrhea.  He has no urinary complaints.  Patient offers no specific complaints today.    REVIEW OF SYSTEMS:   Review of Systems  Constitutional: Negative.  Negative for fever, malaise/fatigue and weight loss.  Respiratory: Negative.  Negative for cough, hemoptysis and shortness of breath.   Cardiovascular: Negative.  Negative for chest pain and leg swelling.  Gastrointestinal: Negative.  Negative for abdominal pain.  Genitourinary: Negative.  Negative for dysuria.  Musculoskeletal: Negative.  Negative for back pain.  Skin: Negative.  Negative for rash.  Neurological: Negative.  Negative for dizziness, focal weakness, weakness and headaches.  Psychiatric/Behavioral: Negative.  The patient is not nervous/anxious.     As per HPI. Otherwise, a complete review of systems is negative.  PAST MEDICAL HISTORY: Past Medical History:  Diagnosis Date   GERD (gastroesophageal reflux disease)    Gout    Hypertension    PVC (premature ventricular contraction)    Sleep apnea    Squamous cell carcinoma of face     PAST SURGICAL HISTORY: Past Surgical History:  Procedure Laterality Date   KNEE SURGERY  1974   PARATHYROIDECTOMY Left 03/29/2020   Procedure: PARATHYROIDECTOMY;  Surgeon:  Linus Salmons, MD;  Location: [facility] ORS;  Service: ENT;  Laterality: Left;    FAMILY HISTORY: Family History  Problem Relation Age of Onset   Hypertension Mother    Cancer Mother        skin cancer   Hypertension Father    Cancer Father        skin   Cancer Sister        skin cancer    ADVANCED DIRECTIVES (Y/N):  N  HEALTH MAINTENANCE: Social History   Tobacco Use   Smoking status: Never   Smokeless tobacco: Never  Substance Use Topics   Alcohol use: Yes    Comment: occasional   Drug use: Never     Colonoscopy:  PAP:  Bone density:  Lipid panel:  No Known Allergies  Current Outpatient Medications  Medication Sig Dispense Refill   acetaminophen (TYLENOL) 500 MG tablet Take 1,000 mg by mouth every 6 (six) hours as needed for moderate pain or headache.     allopurinol (ZYLOPRIM) 100 MG tablet Take 100 mg by mouth daily.     Carboxymethylcellulose Sodium (ARTIFICIAL TEARS OP) Place 1 drop into both eyes daily as needed (dry eyes).     carvedilol (COREG) 25 MG tablet Take 25 mg by mouth 2 (two) times daily.     chlorthalidone (HYGROTON) 25 MG tablet Take 25 mg by mouth every morning.     colchicine 0.6 MG tablet Take 0.6 mg by mouth 2 (two) times daily as needed (gout flare).      LORazepam (ATIVAN) 0.5 MG tablet Take 0.5 mg by mouth at bedtime as needed for  sleep.     losartan (COZAAR) 100 MG tablet Take 100 mg by mouth daily.     Multiple Vitamin (MULTIVITAMIN WITH MINERALS) TABS tablet Take 1 tablet by mouth daily.     naproxen sodium (ALEVE) 220 MG tablet Take 220 mg by mouth 2 (two) times daily as needed (pain).     niacinamide 100 MG tablet Take 100 mg by mouth 2 (two) times daily with a meal.     ofloxacin (OCUFLOX) 0.3 % ophthalmic solution Place 2 drops into both eyes daily as needed (infection).     omeprazole (PRILOSEC) 20 MG capsule Take 20 mg by mouth daily.     ondansetron (ZOFRAN) 8 MG tablet Take 1 tablet (8 mg total) by mouth every 8 (eight) hours as  needed for nausea or vomiting. Start on the third day after cisplatin. 30 tablet 1   potassium chloride SA (KLOR-CON M) 20 MEQ tablet Take 1 tablet (20 mEq total) by mouth 2 (two) times daily. 60 tablet 1   prednisoLONE acetate (PRED FORTE) 1 % ophthalmic suspension Place 1 drop into both eyes daily as needed (allergies).     prochlorperazine (COMPAZINE) 10 MG tablet Take 1 tablet (10 mg total) by mouth every 6 (six) hours as needed (Nausea or vomiting). 30 tablet 1   No current facility-administered medications for this visit.    OBJECTIVE: Vitals:   08/07/23 0916  BP: (!) 167/91  Pulse: 64  Resp: 18  Temp: 97.9 F (36.6 C)  SpO2: 100%       Body mass index is 35.49 kg/m.    ECOG FS:0 - Asymptomatic  General: Well-developed, well-nourished, no acute distress. Eyes: Pink conjunctiva, anicteric sclera. HEENT: Normocephalic, moist mucous membranes.  No palpable lymphadenopathy. Lungs: No audible wheezing or coughing. Heart: Regular rate and rhythm. Abdomen: Soft, nontender, no obvious distention. Musculoskeletal: No edema, cyanosis, or clubbing. Neuro: Alert, answering all questions appropriately. Cranial nerves grossly intact. Skin: No rashes or petechiae noted. Psych: Normal affect.   LAB RESULTS:  Lab Results  Component Value Date   NA 136 08/07/2023   K 3.2 (L) 08/07/2023   CL 100 08/07/2023   CO2 27 08/07/2023   GLUCOSE 135 (H) 08/07/2023   BUN 14 08/07/2023   CREATININE 1.33 (H) 08/07/2023   CALCIUM 8.5 (L) 08/07/2023   GFRNONAA 58 (L) 08/07/2023   GFRAA >60 03/29/2020    Lab Results  Component Value Date   WBC 6.1 08/07/2023   NEUTROABS 4.8 08/07/2023   HGB 12.3 (L) 08/07/2023   HCT 36.7 (L) 08/07/2023   MCV 105.5 (H) 08/07/2023   PLT 195 08/07/2023     STUDIES: No results found.  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  Biopsy confirmed squamous cell carcinoma.  PET scan results from April 04, 2023 reviewed independently with hypermetabolism in left parotid gland as well as several cervical lymph nodes.  Patient received his sixth and final cycle of cisplatin on June 12, 2023 and completed XRT on June 21, 2023.  Cisplatin was discontinued secondary to increasing creatinine.  No further intervention is needed.  Return to clinic in 2 months with repeat imaging using PET scan and further evaluation.  Patient will also need to return to ENT for follow-up evaluation.  Hypokalemia: Chronic and unchanged.  Continue  oral potassium supplementation.  Patient declined IV replacement today.  He was also previously given dietary recommendations.   Hypomagnesia: Magnesium improved to 1.6.  Recommended oral magnesium supplementation. Renal insufficiency: Creatinine improved to 1.33.  He does not require IV fluids today.   Anemia: Improving, monitor.  Patient expressed understanding and was in agreement with this plan. He also understands that He can call clinic at any time with any questions, concerns, or complaints.    Cancer Staging  Primary squamous cell carcinoma of parotid gland [facility]) Staging form: Major Salivary Glands, AJCC 8th Edition - Clinical stage from 04/15/2023: Stage IVA (cT2, cN2b, cM0) - Signed by Jeralyn Ruths, MD on 04/15/2023   Jeralyn Ruths, MD   08/07/2023 9:53 AM

## 2023-08-08 ENCOUNTER — Telehealth: Payer: Self-pay | Admitting: *Deleted

## 2023-08-08 ENCOUNTER — Encounter: Payer: Self-pay | Admitting: Oncology

## 2023-08-08 NOTE — Telephone Encounter (Signed)
Received an insurance claim form, completed and sent for doctor signature

## 2023-08-09 ENCOUNTER — Telehealth: Payer: Self-pay

## 2023-08-09 ENCOUNTER — Encounter: Payer: Self-pay | Admitting: Oncology

## 2023-08-09 NOTE — Telephone Encounter (Signed)
Insurance form handed to patient. A copy has been made and on my desk for Steward Drone to pick up Monday.

## 2023-08-09 NOTE — Telephone Encounter (Signed)
Referral has been faxed to [facility].

## 2023-08-13 ENCOUNTER — Encounter: Payer: Self-pay | Admitting: Dermatology

## 2023-08-26 ENCOUNTER — Ambulatory Visit (INDEPENDENT_AMBULATORY_CARE_PROVIDER_SITE_OTHER): Payer: Medicare PPO | Admitting: Internal Medicine

## 2023-08-26 VITALS — BP 174/100 | HR 64 | Resp 16 | Ht 72.0 in | Wt 267.0 lb

## 2023-08-26 DIAGNOSIS — Z7189 Other specified counseling: Secondary | ICD-10-CM

## 2023-08-26 DIAGNOSIS — G4733 Obstructive sleep apnea (adult) (pediatric): Secondary | ICD-10-CM

## 2023-08-26 NOTE — Patient Instructions (Signed)

## 2023-08-26 NOTE — Progress Notes (Unsigned)
[facility] [address] [place], [place] [address]  Pulmonary Sleep Medicine   Office Visit Note  Patient Name: Douglas Daugherty DOB: Apr 27, 1956 MRN [id number]    Chief Complaint: Obstructive Sleep Apnea visit  Brief History:  Douglas Daugherty is seen today for an annual follow up on APAP at 14-20 cmh20.  The patient has a 15 year history of sleep apnea. Patient is using PAP nightly.  The patient feels rested after sleeping with PAP.  The patient reports benefiting from PAP use. Reported sleepiness is improved and the Epworth Sleepiness Score is 0 out of 24. The patient does not take naps. The patient complains of the following: No complaints.  The compliance download shows 100% compliance with an average use time of 8:36 hours. The AHI is 1.2.  The patient does not complain of limb movements disrupting sleep. 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   ROS  General: (-) fever, (-) chills, (-) night sweat Nose and Sinuses: (-) nasal stuffiness or itchiness, (-) postnasal drip, (-) nosebleeds, (-) sinus trouble. Mouth and Throat: (-) sore throat, (-) hoarseness. Neck: (-) swollen glands, (-) enlarged thyroid, (-) neck pain. Respiratory: - cough, - shortness of breath, - wheezing. Neurologic: - numbness, - tingling. Psychiatric: - anxiety, - depression   Current Medication: Outpatient Encounter Medications as of 08/26/2023  Medication Sig   acetaminophen (TYLENOL) 500 MG tablet Take 1,000 mg by mouth every 6 (six) hours as needed for moderate pain or headache.   allopurinol (ZYLOPRIM) 100 MG tablet Take 100 mg by mouth daily.   Carboxymethylcellulose Sodium (ARTIFICIAL TEARS OP) Place 1 drop into both eyes daily as needed (dry eyes).   carvedilol (COREG) 25 MG tablet Take 25 mg by mouth 2 (two) times daily.   chlorthalidone (HYGROTON) 25 MG tablet Take 25 mg by mouth every morning.   colchicine 0.6 MG tablet Take 0.6 mg by mouth 2  (two) times daily as needed (gout flare).    LORazepam (ATIVAN) 0.5 MG tablet Take 0.5 mg by mouth at bedtime as needed for sleep.   losartan (COZAAR) 100 MG tablet Take 100 mg by mouth daily.   Multiple Vitamin (MULTIVITAMIN WITH MINERALS) TABS tablet Take 1 tablet by mouth daily.   naproxen sodium (ALEVE) 220 MG tablet Take 220 mg by mouth 2 (two) times daily as needed (pain).   niacinamide 100 MG tablet Take 100 mg by mouth 2 (two) times daily with a meal.   ofloxacin (OCUFLOX) 0.3 % ophthalmic solution Place 2 drops into both eyes daily as needed (infection).   omeprazole (PRILOSEC) 20 MG capsule Take 20 mg by mouth daily.   ondansetron (ZOFRAN) 8 MG tablet Take 1 tablet (8 mg total) by mouth every 8 (eight) hours as needed for nausea or vomiting. Start on the third day after cisplatin.   potassium chloride SA (KLOR-CON M) 20 MEQ tablet Take 1 tablet (20 mEq total) by mouth 2 (two) times daily.   prednisoLONE acetate (PRED FORTE) 1 % ophthalmic suspension Place 1 drop into both eyes daily as needed (allergies).   prochlorperazine (COMPAZINE) 10 MG tablet Take 1 tablet (10 mg total) by mouth every 6 (six) hours as needed (Nausea or vomiting).   No facility-administered encounter medications on file as of 08/26/2023.    Surgical History: Past Surgical History:  Procedure Laterality Date   KNEE SURGERY  1974   PARATHYROIDECTOMY Left 03/29/2020   Procedure: PARATHYROIDECTOMY;  Surgeon: Jenne Campus,  Sibyl Parr, MD;  Location: [facility] ORS;  Service: ENT;  Laterality: Left;    Medical History: Past Medical History:  Diagnosis Date   GERD (gastroesophageal reflux disease)    Gout    Hypertension    PVC (premature ventricular contraction)    Sleep apnea    Squamous cell carcinoma of face     Family History: Non contributory to the present illness  Social History: Social History   Socioeconomic History   Marital status: Widowed    Spouse name: Not on file   Number of children: Not on file    Years of education: Not on file   Highest education level: Not on file  Occupational History   Not on file  Tobacco Use   Smoking status: Never   Smokeless tobacco: Never  Substance and Sexual Activity   Alcohol use: Yes    Comment: occasional   Drug use: Never   Sexual activity: Not on file  Other Topics Concern   Not on file  Social History Narrative   Not on file   Social Determinants of Health   Financial Resource Strain: Low Risk  (04/25/2023)   Overall Financial Resource Strain (CARDIA)    Difficulty of Paying Living Expenses: Not hard at all  Food Insecurity: No Food Insecurity (04/25/2023)   Hunger Vital Sign    Worried About Running Out of Food in the Last Year: Never true    Ran Out of Food in the Last Year: Never true  Transportation Needs: No Transportation Needs (04/25/2023)   PRAPARE - Administrator, Civil Service (Medical): No    Lack of Transportation (Non-Medical): No  Physical Activity: Sufficiently Active (04/25/2023)   Exercise Vital Sign    Days of Exercise per Week: 5 days    Minutes of Exercise per Session: 30 min  Stress: No Stress Concern Present (04/25/2023)   [employer] of Occupational Health - Occupational Stress Questionnaire    Feeling of Stress : Only a little  Social Connections: Moderately Integrated (04/25/2023)   Social Connection and Isolation Panel [NHANES]    Frequency of Communication with Friends and Family: More than three times a week    Frequency of Social Gatherings with Friends and Family: More than three times a week    Attends Religious Services: 1 to 4 times per year    Active Member of [employer] or Organizations: Yes    Attends Banker Meetings: 1 to 4 times per year    Marital Status: Widowed  Intimate Partner Violence: Not At Risk (04/25/2023)   Humiliation, Afraid, Rape, and Kick questionnaire    Fear of Current or Ex-Partner: No    Emotionally Abused: No    Physically Abused: No    Sexually Abused: No     Vital Signs: Blood pressure (!) 174/100, pulse 64, resp. rate 16, height 6' (1.829 m), weight 267 lb (121.1 kg), SpO2 98%. Body mass index is 36.21 kg/m.    Examination: General Appearance: The patient is well-developed, well-nourished, and in no distress. Neck Circumference: 47 cm Skin: Gross inspection of skin unremarkable. Head: normocephalic, no gross deformities. Eyes: no gross deformities noted. ENT: ears appear grossly normal Neurologic: Alert and oriented. No involuntary movements.  STOP BANG RISK ASSESSMENT S (snore) Have you been told that you snore?     NO   T (tired) Are you often tired, fatigued, or sleepy during the day?   NO  O (obstruction) Do you stop breathing, choke, or gasp  during sleep? NO   P (pressure) Do you have or are you being treated for high blood pressure? YES   B (BMI) Is your body index greater than 35 kg/m? YES   A (age) Are you 26 years old or older? YES   N (neck) Do you have a neck circumference greater than 16 inches?   YES   G (gender) Are you a male? YES   TOTAL STOP/BANG "YES" ANSWERS 5       A STOP-Bang score of 2 or less is considered low risk, and a score of 5 or more is high risk for having either moderate or severe OSA. For people who score 3 or 4, doctors may need to perform further assessment to determine how likely they are to have OSA.         EPWORTH SLEEPINESS SCALE:  Scale:  (0)= no chance of dozing; (1)= slight chance of dozing; (2)= moderate chance of dozing; (3)= high chance of dozing  Chance  Situtation    Sitting and reading: 0    Watching TV: 0    Sitting Inactive in public: 0    As a passenger in car: 0      Lying down to rest: 0    Sitting and talking: 0    Sitting quielty after lunch: 0    In a car, stopped in traffic: 0   TOTAL SCORE:   0 out of 24    SLEEP STUDIES:  PSG (10/07/08) AHI 75.6, min SPO2 31%   CPAP COMPLIANCE DATA:  Date Range: 08/22/22 - 08/21/23  Average Daily  Use: 8:36 hours  Median Use: 8:36 hours  Compliance for > 4 Hours: 365 days  AHI: 1.2. respiratory events per hour  Days Used: 365/365  Mask Leak: 8.1  95th Percentile Pressure: 18.2 cmh20         LABS: Recent Results (from the past 2160 hour(s))  Magnesium     Status: Abnormal   Collection Time: 05/29/23  7:53 AM  Result Value Ref Range   Magnesium 1.4 (L) 1.7 - 2.4 mg/dL    Comment: Performed at [facility], [address] [place], [place] [address]  Basic Metabolic Panel - Cancer Center Only     Status: Abnormal   Collection Time: 05/29/23  7:53 AM  Result Value Ref Range   Sodium 136 135 - 145 mmol/L   Potassium 3.3 (L) 3.5 - 5.1 mmol/L   Chloride 95 (L) 98 - 111 mmol/L   CO2 27 22 - 32 mmol/L   Glucose, Bld 142 (H) 70 - 99 mg/dL    Comment: Glucose reference range applies only to samples taken after fasting for at least 8 hours.   BUN 26 (H) 8 - 23 mg/dL   Creatinine 4.78 (H) 2.95 - 1.24 mg/dL   Calcium 6.9 (L) 8.9 - 10.3 mg/dL   GFR, Estimated 56 (L) >60 mL/min    Comment: (NOTE) Calculated using the CKD-EPI Creatinine Equation (2021)    Anion gap 14 5 - 15    Comment: Performed at [facility], [address] [place], [place] [address]  CBC with Differential (Cancer Center Only)     Status: Abnormal   Collection Time: 05/29/23  7:53 AM  Result Value Ref Range   WBC Count 2.6 (L) 4.0 - 10.5 K/uL   RBC 3.92 (L) 4.22 - 5.81 MIL/uL   Hemoglobin 12.9 (L) 13.0 - 17.0 g/dL   HCT 86.5 (L) 78.4 - 69.6 %  MCV 91.8 80.0 - 100.0 fL   MCH 32.9 26.0 - 34.0 pg   MCHC 35.8 30.0 - 36.0 g/dL   RDW 91.4 78.2 - 95.6 %   Platelet Count 77 (L) 150 - 400 K/uL   nRBC 0.0 0.0 - 0.2 %   Neutrophils Relative % 77 %   Neutro Abs 2.0 1.7 - 7.7 K/uL   Lymphocytes Relative 15 %   Lymphs Abs 0.4 (L) 0.7 - 4.0 K/uL   Monocytes Relative 7 %   Monocytes Absolute 0.2 0.1 - 1.0 K/uL   Eosinophils Relative 0 %   Eosinophils Absolute 0.0 0.0 - 0.5 K/uL    Basophils Relative 0 %   Basophils Absolute 0.0 0.0 - 0.1 K/uL   Immature Granulocytes 1 %   Abs Immature Granulocytes 0.03 0.00 - 0.07 K/uL    Comment: Performed at [facility], [address] [place], [place] [address]  Rad Jeralene Huff Session Summary     Status: None   Collection Time: 05/29/23  8:22 AM  Result Value Ref Range   Course ID [email or web address]    Course Start Date 04/18/2023    Session Number 21    Course First Treatment Date 04/30/2023  3:27 PM    Course Last Treatment Date 05/29/2023  8:20 AM    Course Elapsed Days 29    Reference Point ID Lt Parotid DP    Reference Point Dosage Given to Date 42.00000007 Gy   Reference Point Session Dosage Given 1.99999999 Gy   Plan ID [email or web address]_[email or web address]:1    Plan Name HN    Plan Fractions Treated to Date 7    Plan Total Fractions Prescribed 19    Plan Prescribed Dose Per Fraction 2 Gy   Plan Total Prescribed Dose 38.000000 Gy   Plan Primary Reference Point Lt Parotid DP   Magnesium     Status: Abnormal   Collection Time: 06/05/23  8:01 AM  Result Value Ref Range   Magnesium 1.4 (L) 1.7 - 2.4 mg/dL    Comment: Performed at [facility], [address] [place], [place] [address]  Basic Metabolic Panel - Cancer Center Only     Status: Abnormal   Collection Time: 06/05/23  8:01 AM  Result Value Ref Range   Sodium 135 135 - 145 mmol/L   Potassium 3.3 (L) 3.5 - 5.1 mmol/L   Chloride 97 (L) 98 - 111 mmol/L   CO2 25 22 - 32 mmol/L   Glucose, Bld 158 (H) 70 - 99 mg/dL    Comment: Glucose reference range applies only to samples taken after fasting for at least 8 hours.   BUN 23 8 - 23 mg/dL   Creatinine 6.96 (H) 2.95 - 1.24 mg/dL   Calcium 8.7 (L) 8.9 - 10.3 mg/dL   GFR, Estimated 55 (L) >60 mL/min    Comment: (NOTE) Calculated using the CKD-EPI Creatinine Equation (2021)    Anion gap 13 5 - 15    Comment: Performed at [facility], [address] [place], [place] [address]  CBC with Differential (Cancer Center Only)      Status: Abnormal   Collection Time: 06/05/23  8:01 AM  Result Value Ref Range   WBC Count 1.2 (L) 4.0 - 10.5 K/uL   RBC 3.67 (L) 4.22 - 5.81 MIL/uL   Hemoglobin 12.1 (L) 13.0 - 17.0 g/dL   HCT 24.4 (L) 01.0 - 27.2 %   MCV 92.1 80.0 - 100.0 fL   MCH 33.0 26.0 -  34.0 pg   MCHC 35.8 30.0 - 36.0 g/dL   RDW 16.1 09.6 - 04.5 %   Platelet Count 150 150 - 400 K/uL   nRBC 0.0 0.0 - 0.2 %   Neutrophils Relative % 56 %   Neutro Abs 0.7 (L) 1.7 - 7.7 K/uL   Lymphocytes Relative 32 %   Lymphs Abs 0.4 (L) 0.7 - 4.0 K/uL   Monocytes Relative 10 %   Monocytes Absolute 0.1 0.1 - 1.0 K/uL   Eosinophils Relative 1 %   Eosinophils Absolute 0.0 0.0 - 0.5 K/uL   Basophils Relative 1 %   Basophils Absolute 0.0 0.0 - 0.1 K/uL   Immature Granulocytes 0 %   Abs Immature Granulocytes 0.00 0.00 - 0.07 K/uL    Comment: Performed at [facility], [address] [place], [place] [address]  Rad Jeralene Huff Session Summary     Status: None   Collection Time: 06/05/23  8:53 AM  Result Value Ref Range   Course ID [email or web address]    Course Start Date 04/18/2023    Session Number 22    Course First Treatment Date 04/30/2023  3:27 PM    Course Last Treatment Date 06/05/2023  8:51 AM    Course Elapsed Days 36    Reference Point ID Lt Parotid DP    Reference Point Dosage Given to Date 44.00000006 Gy   Reference Point Session Dosage Given 1.99999999 Gy   Plan ID HN_L_[email or web address]:1    Plan Name HN    Plan Fractions Treated to Date 8    Plan Total Fractions Prescribed 19    Plan Prescribed Dose Per Fraction 2 Gy   Plan Total Prescribed Dose 38.000000 Gy   Plan Primary Reference Point Lt Parotid DP   Rad Onc Aria Session Summary     Status: None   Collection Time: 06/06/23  2:01 PM  Result Value Ref Range   Course ID [email or web address]    Course Start Date 04/18/2023    Session Number 23    Course First Treatment Date 04/30/2023  3:27 PM    Course Last Treatment Date 06/06/2023  1:58 PM    Course Elapsed Days 37    Reference Point ID  Lt Parotid DP    Reference Point Dosage Given to Date 46.00000005 Gy   Reference Point Session Dosage Given 1.99999999 Gy   Plan ID HN_L_Parotid:1    Plan Name HN    Plan Fractions Treated to Date 9    Plan Total Fractions Prescribed 19    Plan Prescribed Dose Per Fraction 2 Gy   Plan Total Prescribed Dose 38.000000 Gy   Plan Primary Reference Point Lt Parotid DP   Rad Onc Aria Session Summary     Status: None   Collection Time: 06/07/23 12:10 PM  Result Value Ref Range   Course ID [email or web address]    Course Start Date 04/18/2023    Session Number 24    Course First Treatment Date 04/30/2023  3:27 PM    Course Last Treatment Date 06/07/2023 12:08 PM    Course Elapsed Days 38    Reference Point ID Lt Parotid DP    Reference Point Dosage Given to Date 48.00000004 Gy   Reference Point Session Dosage Given 1.99999999 Gy   Plan ID HN_L_Parotid:1    Plan Name HN    Plan Fractions Treated to Date 10    Plan Total Fractions Prescribed 19    Plan Prescribed Dose Per Fraction 2 Gy   Plan  Total Prescribed Dose 38.000000 Gy   Plan Primary Reference Point Lt Parotid DP   Rad Onc Aria Session Summary     Status: None   Collection Time: 06/10/23  1:51 PM  Result Value Ref Range   Course ID [email or web address]    Course Start Date 04/18/2023    Session Number 25    Course First Treatment Date 04/30/2023  3:27 PM    Course Last Treatment Date 06/10/2023  1:49 PM    Course Elapsed Days 41    Reference Point ID Lt Parotid DP    Reference Point Dosage Given to Date 50.00000003 Gy   Reference Point Session Dosage Given 1.99999999 Gy   Plan ID HN_L_[email or web address]:1    Plan Name HN    Plan Fractions Treated to Date 56    Plan Total Fractions Prescribed 19    Plan Prescribed Dose Per Fraction 2 Gy   Plan Total Prescribed Dose 38.000000 Gy   Plan Primary Reference Point Lt Parotid DP   Rad Onc Aria Session Summary     Status: None   Collection Time: 06/11/23  1:51 PM  Result Value Ref Range   Course ID [email or web address]    Course Start Date  04/18/2023    Session Number 26    Course First Treatment Date 04/30/2023  3:27 PM    Course Last Treatment Date 06/11/2023  1:49 PM    Course Elapsed Days 42    Reference Point ID Lt Parotid DP    Reference Point Dosage Given to Date 52.00000002 Gy   Reference Point Session Dosage Given 1.99999999 Gy   Plan ID HN_L_Parotid:1    Plan Name HN    Plan Fractions Treated to Date 12    Plan Total Fractions Prescribed 19    Plan Prescribed Dose Per Fraction 2 Gy   Plan Total Prescribed Dose 38.000000 Gy   Plan Primary Reference Point Lt Parotid DP   Rad Onc Aria Session Summary     Status: None   Collection Time: 06/12/23  8:52 AM  Result Value Ref Range   Course ID [email or web address]    Course Start Date 04/18/2023    Session Number 27    Course First Treatment Date 04/30/2023  3:27 PM    Course Last Treatment Date 06/12/2023  8:49 AM    Course Elapsed Days 43    Reference Point ID Lt Parotid DP    Reference Point Dosage Given to Date 54.00000001 Gy   Reference Point Session Dosage Given 1.99999999 Gy   Plan ID HN_L_Parotid:1    Plan Name HN    Plan Fractions Treated to Date 13    Plan Total Fractions Prescribed 19    Plan Prescribed Dose Per Fraction 2 Gy   Plan Total Prescribed Dose 38.000000 Gy   Plan Primary Reference Point Lt Parotid DP   Magnesium     Status: Abnormal   Collection Time: 06/12/23  8:58 AM  Result Value Ref Range   Magnesium 1.3 (L) 1.7 - 2.4 mg/dL    Comment: Performed at [facility], [address] [place], [place] [address]  Basic Metabolic Panel - Cancer Center Only     Status: Abnormal   Collection Time: 06/12/23  8:58 AM  Result Value Ref Range   Sodium 136 135 - 145 mmol/L   Potassium 3.3 (L) 3.5 - 5.1 mmol/L   Chloride 99 98 - 111 mmol/L   CO2 26 22 - 32 mmol/L   Glucose, Bld 139 (H) 70 -  99 mg/dL    Comment: Glucose reference range applies only to samples taken after fasting for at least 8 hours.   BUN 22 8 - 23 mg/dL   Creatinine 0.98 (H) 1.19 - 1.24  mg/dL   Calcium 8.6 (L) 8.9 - 10.3 mg/dL   GFR, Estimated 53 (L) >60 mL/min    Comment: (NOTE) Calculated using the CKD-EPI Creatinine Equation (2021)    Anion gap 11 5 - 15    Comment: Performed at [facility], [address] [place], [place] [address]  CBC with Differential (Cancer Center Only)     Status: Abnormal   Collection Time: 06/12/23  8:58 AM  Result Value Ref Range   WBC Count 2.3 (L) 4.0 - 10.5 K/uL   RBC 3.36 (L) 4.22 - 5.81 MIL/uL   Hemoglobin 11.2 (L) 13.0 - 17.0 g/dL   HCT 95.6 (L) 21.3 - 08.6 %   MCV 94.3 80.0 - 100.0 fL   MCH 33.3 26.0 - 34.0 pg   MCHC 35.3 30.0 - 36.0 g/dL   RDW 57.8 46.9 - 62.9 %   Platelet Count 273 150 - 400 K/uL   nRBC 0.0 0.0 - 0.2 %   Neutrophils Relative % 62 %   Neutro Abs 1.5 (L) 1.7 - 7.7 K/uL   Lymphocytes Relative 18 %   Lymphs Abs 0.4 (L) 0.7 - 4.0 K/uL   Monocytes Relative 18 %   Monocytes Absolute 0.4 0.1 - 1.0 K/uL   Eosinophils Relative 0 %   Eosinophils Absolute 0.0 0.0 - 0.5 K/uL   Basophils Relative 0 %   Basophils Absolute 0.0 0.0 - 0.1 K/uL   Immature Granulocytes 2 %   Abs Immature Granulocytes 0.04 0.00 - 0.07 K/uL    Comment: Performed at [facility], [address] [place], [place] [address]  Rad Jeralene Huff Session Summary     Status: None   Collection Time: 06/13/23  1:45 PM  Result Value Ref Range   Course ID [email or web address]    Course Start Date 04/18/2023    Session Number 28    Course First Treatment Date 04/30/2023  3:27 PM    Course Last Treatment Date 06/13/2023  1:43 PM    Course Elapsed Days 44    Reference Point ID Lt Parotid DP    Reference Point Dosage Given to Date 56 Gy   Reference Point Session Dosage Given 1.99999999 Gy   Plan ID [email or web address]_Parotid:1    Plan Name HN    Plan Fractions Treated to Date 14    Plan Total Fractions Prescribed 19    Plan Prescribed Dose Per Fraction 2 Gy   Plan Total Prescribed Dose 38.000000 Gy   Plan Primary Reference Point Lt Parotid DP   Rad Onc Aria Session  Summary     Status: None   Collection Time: 06/14/23 11:57 AM  Result Value Ref Range   Course ID [email or web address]    Course Start Date 04/18/2023    Session Number 29    Course First Treatment Date 04/30/2023  3:27 PM    Course Last Treatment Date 06/14/2023 11:56 AM    Course Elapsed Days 45    Reference Point ID Lt Parotid DP    Reference Point Dosage Given to Date 57.99999999 Gy   Reference Point Session Dosage Given 1.99999999 Gy   Plan ID [email or web address]_[email or web address]:1    Plan Name HN    Plan Fractions Treated to Date 15    Plan Total Fractions Prescribed 19  Plan Prescribed Dose Per Fraction 2 Gy   Plan Total Prescribed Dose 38.000000 Gy   Plan Primary Reference Point Lt Parotid DP   Rad Onc Aria Session Summary     Status: None   Collection Time: 06/17/23  1:49 PM  Result Value Ref Range   Course ID [email or web address]    Course Start Date 04/18/2023    Session Number 30    Course First Treatment Date 04/30/2023  3:27 PM    Course Last Treatment Date 06/17/2023  1:47 PM    Course Elapsed Days 48    Reference Point ID Lt Parotid DP    Reference Point Dosage Given to Date 59.99999998 Gy   Reference Point Session Dosage Given 1.99999999 Gy   Plan ID HN_L_[email or web address]:1    Plan Name HN    Plan Fractions Treated to Date 4    Plan Total Fractions Prescribed 19    Plan Prescribed Dose Per Fraction 2 Gy   Plan Total Prescribed Dose 38.000000 Gy   Plan Primary Reference Point Lt Parotid DP   Rad Onc Aria Session Summary     Status: None   Collection Time: 06/18/23  2:16 PM  Result Value Ref Range   Course ID [email or web address]    Course Start Date 04/18/2023    Session Number 31    Course First Treatment Date 04/30/2023  3:27 PM    Course Last Treatment Date 06/18/2023  2:14 PM    Course Elapsed Days 49    Reference Point ID Lt Parotid DP    Reference Point Dosage Given to Date 16.10960454 Gy   Reference Point Session Dosage Given 1.99999999 Gy   Plan ID [email or web address]_Parotid:1    Plan Name HN    Plan Fractions Treated to Date 17    Plan  Total Fractions Prescribed 19    Plan Prescribed Dose Per Fraction 2 Gy   Plan Total Prescribed Dose 38.000000 Gy   Plan Primary Reference Point Lt Parotid DP   Rad Onc Aria Session Summary     Status: None   Collection Time: 06/19/23  8:07 AM  Result Value Ref Range   Course ID [email or web address]    Course Start Date 04/18/2023    Session Number 32    Course First Treatment Date 04/30/2023  3:27 PM    Course Last Treatment Date 06/19/2023  8:05 AM    Course Elapsed Days 50    Reference Point ID Lt Parotid DP    Reference Point Dosage Given to Date 09.81191478 Gy   Reference Point Session Dosage Given 1.99999999 Gy   Plan ID [email or web address]_[email or web address]:1    Plan Name HN    Plan Fractions Treated to Date 18    Plan Total Fractions Prescribed 19    Plan Prescribed Dose Per Fraction 2 Gy   Plan Total Prescribed Dose 38.000000 Gy   Plan Primary Reference Point Lt Parotid DP   Magnesium     Status: Abnormal   Collection Time: 06/19/23  8:15 AM  Result Value Ref Range   Magnesium 1.2 (L) 1.7 - 2.4 mg/dL    Comment: Performed at [facility], [address] [place], [place] [address]  Basic Metabolic Panel - Cancer Center Only     Status: Abnormal   Collection Time: 06/19/23  8:15 AM  Result Value Ref Range   Sodium 132 (L) 135 - 145 mmol/L   Potassium 3.4 (L) 3.5 - 5.1 mmol/L   Chloride 96 (L) 98 - 111 mmol/L   CO2  24 22 - 32 mmol/L   Glucose, Bld 155 (H) 70 - 99 mg/dL    Comment: Glucose reference range applies only to samples taken after fasting for at least 8 hours.   BUN 26 (H) 8 - 23 mg/dL   Creatinine 8.11 (H) 9.14 - 1.24 mg/dL   Calcium 8.3 (L) 8.9 - 10.3 mg/dL   GFR, Estimated 43 (L) >60 mL/min    Comment: (NOTE) Calculated using the CKD-EPI Creatinine Equation (2021)    Anion gap 12 5 - 15    Comment: Performed at [facility], [address] [place], [place] [address]  CBC with Differential (Cancer Center Only)     Status: Abnormal   Collection Time: 06/19/23  8:15 AM  Result Value  Ref Range   WBC Count 6.3 4.0 - 10.5 K/uL   RBC 3.21 (L) 4.22 - 5.81 MIL/uL   Hemoglobin 10.9 (L) 13.0 - 17.0 g/dL   HCT 62.1 (L) 30.8 - 65.7 %   MCV 94.7 80.0 - 100.0 fL   MCH 34.0 26.0 - 34.0 pg   MCHC 35.9 30.0 - 36.0 g/dL   RDW 84.6 96.2 - 95.2 %   Platelet Count 343 150 - 400 K/uL   nRBC 0.0 0.0 - 0.2 %   Neutrophils Relative % 76 %   Neutro Abs 4.8 1.7 - 7.7 K/uL   Lymphocytes Relative 8 %   Lymphs Abs 0.5 (L) 0.7 - 4.0 K/uL   Monocytes Relative 12 %   Monocytes Absolute 0.7 0.1 - 1.0 K/uL   Eosinophils Relative 0 %   Eosinophils Absolute 0.0 0.0 - 0.5 K/uL   Basophils Relative 1 %   Basophils Absolute 0.0 0.0 - 0.1 K/uL   Immature Granulocytes 3 %   Abs Immature Granulocytes 0.20 (H) 0.00 - 0.07 K/uL    Comment: Performed at [facility], [address] [place], [place] [address]  Rad Jeralene Huff Session Summary     Status: None   Collection Time: 06/21/23 12:29 PM  Result Value Ref Range   Course ID [id number]    Course Start Date 04/18/2023    Session Number 33    Course First Treatment Date 04/30/2023  3:27 PM    Course Last Treatment Date 06/21/2023 12:27 PM    Course Elapsed Days 52    Reference Point ID Lt Parotid DP    Reference Point Dosage Given to Date [id number] Gy   Reference Point Session Dosage Given 1.99999999 Gy   Plan ID [id number]:1    Plan Name HN    Plan Fractions Treated to Date 19    Plan Total Fractions Prescribed 19    Plan Prescribed Dose Per Fraction 2 Gy   Plan Total Prescribed Dose 38.000000 Gy   Plan Primary Reference Point Lt Parotid DP   Magnesium     Status: Abnormal   Collection Time: 08/07/23  9:06 AM  Result Value Ref Range   Magnesium 1.6 (L) 1.7 - 2.4 mg/dL    Comment: Performed at [facility], [address] [place], [place] [address]  Basic Metabolic Panel - Cancer Center Only     Status: Abnormal   Collection Time: 08/07/23  9:06 AM  Result Value Ref Range   Sodium 136 135 - 145 mmol/L   Potassium 3.2 (L) 3.5 -  5.1 mmol/L   Chloride 100 98 - 111 mmol/L   CO2 27 22 - 32 mmol/L   Glucose, Bld 135 (H) 70 - 99 mg/dL  Comment: Glucose reference range applies only to samples taken after fasting for at least 8 hours.   BUN 14 8 - 23 mg/dL   Creatinine 8.29 (H) 5.62 - 1.24 mg/dL   Calcium 8.5 (L) 8.9 - 10.3 mg/dL   GFR, Estimated 58 (L) >60 mL/min    Comment: (NOTE) Calculated using the CKD-EPI Creatinine Equation (2021)    Anion gap 9 5 - 15    Comment: Performed at [facility], [address] [place], [place] [address]  CBC with Differential (Cancer Center Only)     Status: Abnormal   Collection Time: 08/07/23  9:06 AM  Result Value Ref Range   WBC Count 6.1 4.0 - 10.5 K/uL   RBC 3.48 (L) 4.22 - 5.81 MIL/uL   Hemoglobin 12.3 (L) 13.0 - 17.0 g/dL   HCT 57.8 (L) 46.9 - 62.9 %   MCV 105.5 (H) 80.0 - 100.0 fL   MCH 35.3 (H) 26.0 - 34.0 pg   MCHC 33.5 30.0 - 36.0 g/dL   RDW 52.8 41.3 - 24.4 %   Platelet Count 195 150 - 400 K/uL   nRBC 0.0 0.0 - 0.2 %   Neutrophils Relative % 79 %   Neutro Abs 4.8 1.7 - 7.7 K/uL   Lymphocytes Relative 9 %   Lymphs Abs 0.5 (L) 0.7 - 4.0 K/uL   Monocytes Relative 9 %   Monocytes Absolute 0.6 0.1 - 1.0 K/uL   Eosinophils Relative 3 %   Eosinophils Absolute 0.2 0.0 - 0.5 K/uL   Basophils Relative 0 %   Basophils Absolute 0.0 0.0 - 0.1 K/uL   Immature Granulocytes 0 %   Abs Immature Granulocytes 0.02 0.00 - 0.07 K/uL    Comment: Performed at [facility], [address] [place], [place] [address]    Radiology: No results found.  No results found.  No results found.    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 OSA on CPAP The patient does tolerate PAP and reports  benefit from  PAP use. The patient was reminded how to clean equipment and advised to replace supplies routinely. The patient was also counselled on weight loss. The compliance is excellent. The AHI is 1.2.   OSA on cpap- controlled. Continue with excellent compliance with pap. CPAP continues to be medically necessary to treat this patient's OSA. F/u one year.    2. CPAP use counseling CPAP Counseling: had a lengthy discussion with the patient regarding the importance of PAP therapy in management of the sleep apnea. Patient appears to understand the risk factor reduction and also understands the risks associated with untreated sleep apnea. Patient will try to make a good faith effort to remain compliant with therapy. Also instructed the patient on proper cleaning of the device including the water must be changed daily if possible and use of distilled water is preferred. Patient understands that the machine should be regularly cleaned with appropriate recommended cleaning solutions that do not damage the PAP machine for example given white vinegar and water rinses. Other methods such as ozone treatment may not be as good as these simple methods to achieve cleaning.      General Counseling: I have discussed the findings of the evaluation  and examination with Douglas Daugherty.  I have also discussed any further diagnostic evaluation thatmay be needed or ordered today. Douglas Daugherty verbalizes understanding of the findings of todays visit. We also reviewed his medications today and discussed drug interactions and side effects including but not limited excessive drowsiness and altered mental states. We also discussed that there is always a risk not just to him but also people around him. he has been encouraged to call the office with any questions or concerns that should arise related to todays visit.  No orders of the defined types were placed in this encounter.       I have personally obtained a history, examined the patient,  evaluated laboratory and imaging results, formulated the assessment and plan and placed orders. This patient was seen today by Emmaline Kluver, PA-C in collaboration with Dr. Freda Munro.   Douglas Pax, MD [facility] Diplomate ABMS Pulmonary Critical Care Medicine and Sleep Medicine

## 2023-09-18 ENCOUNTER — Ambulatory Visit
Admission: RE | Admit: 2023-09-18 | Discharge: 2023-09-18 | Disposition: A | Discharge status: Home / Self Care | Payer: Medicare PPO | Source: Ambulatory Visit | Attending: Oncology | Admitting: Oncology

## 2023-09-18 DIAGNOSIS — C07 Malignant neoplasm of parotid gland: Secondary | ICD-10-CM | POA: Insufficient documentation

## 2023-09-18 LAB — GLUCOSE, CAPILLARY: Glucose-Capillary: 141 mg/dL — ABNORMAL HIGH (ref 70–99)

## 2023-09-18 MED ORDER — FLUDEOXYGLUCOSE F - 18 (FDG) INJECTION
12.0000 | Freq: Once | INTRAVENOUS | Status: AC | PRN
  Administered 2023-09-18: 12.26 via INTRAVENOUS

## 2023-09-24 ENCOUNTER — Other Ambulatory Visit: Payer: Self-pay | Admitting: *Deleted

## 2023-09-24 DIAGNOSIS — C07 Malignant neoplasm of parotid gland: Secondary | ICD-10-CM

## 2023-09-25 ENCOUNTER — Inpatient Hospital Stay: Payer: Medicare PPO

## 2023-09-25 ENCOUNTER — Inpatient Hospital Stay: Payer: Medicare PPO | Admitting: Oncology

## 2023-09-30 ENCOUNTER — Inpatient Hospital Stay: Payer: Medicare PPO | Admitting: Oncology

## 2023-09-30 ENCOUNTER — Inpatient Hospital Stay: Payer: Medicare PPO | Attending: Oncology

## 2023-09-30 ENCOUNTER — Encounter: Payer: Self-pay | Admitting: Oncology

## 2023-09-30 VITALS — BP 183/96 | HR 68 | Temp 98.1°F | Resp 18 | Ht 72.0 in | Wt 266.0 lb

## 2023-09-30 DIAGNOSIS — I10 Essential (primary) hypertension: Secondary | ICD-10-CM | POA: Insufficient documentation

## 2023-09-30 DIAGNOSIS — C07 Malignant neoplasm of parotid gland: Secondary | ICD-10-CM | POA: Insufficient documentation

## 2023-09-30 DIAGNOSIS — E871 Hypo-osmolality and hyponatremia: Secondary | ICD-10-CM | POA: Insufficient documentation

## 2023-09-30 DIAGNOSIS — R7989 Other specified abnormal findings of blood chemistry: Secondary | ICD-10-CM | POA: Insufficient documentation

## 2023-09-30 DIAGNOSIS — N289 Disorder of kidney and ureter, unspecified: Secondary | ICD-10-CM | POA: Insufficient documentation

## 2023-09-30 DIAGNOSIS — E876 Hypokalemia: Secondary | ICD-10-CM | POA: Diagnosis not present

## 2023-09-30 LAB — CBC WITH DIFFERENTIAL/PLATELET
Abs Immature Granulocytes: 0.04 10*3/uL (ref 0.00–0.07)
Basophils Absolute: 0.1 10*3/uL (ref 0.0–0.1)
Basophils Relative: 1 %
Eosinophils Absolute: 0.2 10*3/uL (ref 0.0–0.5)
Eosinophils Relative: 3 %
HCT: 43.1 % (ref 39.0–52.0)
Hemoglobin: 14.9 g/dL (ref 13.0–17.0)
Immature Granulocytes: 1 %
Lymphocytes Relative: 12 %
Lymphs Abs: 0.7 10*3/uL (ref 0.7–4.0)
MCH: 33.5 pg (ref 26.0–34.0)
MCHC: 34.6 g/dL (ref 30.0–36.0)
MCV: 96.9 fL (ref 80.0–100.0)
Monocytes Absolute: 0.5 10*3/uL (ref 0.1–1.0)
Monocytes Relative: 9 %
Neutro Abs: 4.7 10*3/uL (ref 1.7–7.7)
Neutrophils Relative %: 74 %
Platelets: 190 10*3/uL (ref 150–400)
RBC: 4.45 MIL/uL (ref 4.22–5.81)
RDW: 11.6 % (ref 11.5–15.5)
WBC: 6.2 10*3/uL (ref 4.0–10.5)
nRBC: 0 % (ref 0.0–0.2)

## 2023-09-30 LAB — MAGNESIUM: Magnesium: 1.7 mg/dL (ref 1.7–2.4)

## 2023-09-30 LAB — BASIC METABOLIC PANEL - CANCER CENTER ONLY
Anion gap: 8 (ref 5–15)
BUN: 16 mg/dL (ref 8–23)
CO2: 31 mmol/L (ref 22–32)
Calcium: 8.7 mg/dL — ABNORMAL LOW (ref 8.9–10.3)
Chloride: 97 mmol/L — ABNORMAL LOW (ref 98–111)
Creatinine: 1.3 mg/dL — ABNORMAL HIGH (ref 0.61–1.24)
GFR, Estimated: 59 mL/min — ABNORMAL LOW (ref >60)
Glucose, Bld: 132 mg/dL — ABNORMAL HIGH (ref 70–99)
Potassium: 3.1 mmol/L — ABNORMAL LOW (ref 3.5–5.1)
Sodium: 133 mmol/L — ABNORMAL LOW (ref 135–145)

## 2023-09-30 NOTE — Progress Notes (Unsigned)
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  INTERVAL HISTORY: Patient returns to clinic today patient returns to clinic today for further evaluation and laboratory work.  His dysphagia has improved since completing his treatments.  He currently feels well.  He has no neurologic complaints.  He denies any recent fevers or illnesses.  He has a fair appetite and denies weight loss.  He has no chest pain, shortness of breath, cough, or hemoptysis.  He denies any nausea, vomiting, constipation, or diarrhea.  He has no urinary complaints.  Patient offers no specific complaints today.    REVIEW OF SYSTEMS:   Review of Systems  Constitutional: Negative.  Negative for fever, malaise/fatigue and weight loss.  Respiratory: Negative.  Negative for cough, hemoptysis and shortness of breath.   Cardiovascular: Negative.  Negative for chest pain and leg swelling.  Gastrointestinal: Negative.  Negative for abdominal pain.  Genitourinary: Negative.  Negative for dysuria.  Musculoskeletal: Negative.  Negative for back pain.  Skin: Negative.  Negative for rash.  Neurological: Negative.  Negative for dizziness, focal weakness, weakness and headaches.  Psychiatric/Behavioral: Negative.  The patient is not nervous/anxious.     As per HPI. Otherwise, a complete review of systems is negative.  PAST MEDICAL HISTORY: Past Medical History:  Diagnosis Date   GERD (gastroesophageal reflux disease)    Gout    Hypertension    PVC (premature ventricular contraction)    Sleep apnea    Squamous cell carcinoma of face     PAST SURGICAL HISTORY: Past Surgical History:  Procedure Laterality Date   KNEE SURGERY  1974   PARATHYROIDECTOMY Left 03/29/2020   Procedure:  PARATHYROIDECTOMY;  Surgeon: Linus Salmons, MD;  Location: [facility] ORS;  Service: ENT;  Laterality: Left;    FAMILY HISTORY: Family History  Problem Relation Age of Onset   Hypertension Mother    Cancer Mother        skin cancer   Hypertension Father    Cancer Father        skin   Cancer Sister        skin cancer    ADVANCED DIRECTIVES (Y/N):  N  HEALTH MAINTENANCE: Social History   Tobacco Use   Smoking status: Never   Smokeless tobacco: Never  Substance Use Topics   Alcohol use: Yes    Comment: occasional   Drug use: Never     Colonoscopy:  PAP:  Bone density:  Lipid panel:  No Known Allergies  Current Outpatient Medications  Medication Sig Dispense Refill   acetaminophen (TYLENOL) 500 MG tablet Take 1,000 mg by mouth every 6 (six) hours as needed for moderate pain or headache.     allopurinol (ZYLOPRIM) 100 MG tablet Take 100 mg by mouth daily.     Carboxymethylcellulose Sodium (ARTIFICIAL TEARS OP) Place 1 drop into both eyes daily as needed (dry eyes).     carvedilol (COREG) 25 MG tablet Take 25 mg by mouth 2 (two) times daily.     chlorthalidone (HYGROTON) 25 MG tablet Take 25 mg by mouth every morning.     colchicine 0.6 MG tablet Take 0.6 mg by mouth 2 (two) times daily as needed (gout flare).      LORazepam (ATIVAN) 0.5 MG tablet Take 0.5 mg by mouth  at bedtime as needed for sleep.     losartan (COZAAR) 100 MG tablet Take 100 mg by mouth daily.     Magnesium Oxide (MAG-OXIDE PO) Take 750 mg by mouth.     Multiple Vitamin (MULTIVITAMIN WITH MINERALS) TABS tablet Take 1 tablet by mouth daily.     naproxen sodium (ALEVE) 220 MG tablet Take 220 mg by mouth 2 (two) times daily as needed (pain).     niacinamide 100 MG tablet Take 100 mg by mouth 2 (two) times daily with a meal.     ofloxacin (OCUFLOX) 0.3 % ophthalmic solution Place 2 drops into both eyes daily as needed (infection).     omeprazole (PRILOSEC) 20 MG capsule Take 20 mg by mouth daily.      ondansetron (ZOFRAN) 8 MG tablet Take 1 tablet (8 mg total) by mouth every 8 (eight) hours as needed for nausea or vomiting. Start on the third day after cisplatin. 30 tablet 1   potassium chloride SA (KLOR-CON M) 20 MEQ tablet Take 1 tablet (20 mEq total) by mouth 2 (two) times daily. 60 tablet 1   prednisoLONE acetate (PRED FORTE) 1 % ophthalmic suspension Place 1 drop into both eyes daily as needed (allergies).     prochlorperazine (COMPAZINE) 10 MG tablet Take 1 tablet (10 mg total) by mouth every 6 (six) hours as needed (Nausea or vomiting). 30 tablet 1   No current facility-administered medications for this visit.    OBJECTIVE: Vitals:   09/30/23 1053  BP: (!) 183/96  Pulse: 68  Resp: 18  Temp: 98.1 F (36.7 C)  SpO2: 100%       Body mass index is 36.08 kg/m.    ECOG FS:0 - Asymptomatic  General: Well-developed, well-nourished, no acute distress. Eyes: Pink conjunctiva, anicteric sclera. HEENT: Normocephalic, moist mucous membranes.  No palpable lymphadenopathy. Lungs: No audible wheezing or coughing. Heart: Regular rate and rhythm. Abdomen: Soft, nontender, no obvious distention. Musculoskeletal: No edema, cyanosis, or clubbing. Neuro: Alert, answering all questions appropriately. Cranial nerves grossly intact. Skin: No rashes or petechiae noted. Psych: Normal affect.   LAB RESULTS:  Lab Results  Component Value Date   NA 136 08/07/2023   K 3.2 (L) 08/07/2023   CL 100 08/07/2023   CO2 27 08/07/2023   GLUCOSE 135 (H) 08/07/2023   BUN 14 08/07/2023   CREATININE 1.33 (H) 08/07/2023   CALCIUM 8.5 (L) 08/07/2023   GFRNONAA 58 (L) 08/07/2023   GFRAA >60 03/29/2020    Lab Results  Component Value Date   WBC 6.2 09/30/2023   NEUTROABS 4.7 09/30/2023   HGB 14.9 09/30/2023   HCT 43.1 09/30/2023   MCV 96.9 09/30/2023   PLT 190 09/30/2023     STUDIES: NM PET Image Restag (PS) Skull Base To Thigh  Result Date: 09/30/2023 CLINICAL DATA:  Subsequent treatment  strategy for squamous cell carcinoma of the left parotid gland. EXAM: NUCLEAR MEDICINE PET SKULL BASE TO THIGH TECHNIQUE: 12.26 mCi F-18 FDG was injected intravenously. Full-ring PET imaging was performed from the skull base to thigh after the radiotracer. CT data was obtained and used for attenuation correction and anatomic localization. Fasting blood glucose: 141 mg/dl COMPARISON:  PET-CT 47/82/9562 FINDINGS: Mediastinal blood pool activity: SUV max 3.42 Liver activity: SUV max NA NECK: The left parotid gland neoplasm has been surgically excised. No findings for residual or recurrent tumor. No hypermetabolism. The hypermetabolic neck nodes have resolved. No enlarged or hypermetabolic neck nodes. Incidental CT findings: None. CHEST: No hypermetabolic  mediastinal or hilar nodes. No suspicious pulmonary nodules on the CT scan. Incidental CT findings: Stable scattered atherosclerotic calcifications involving the thoracic aorta. Stable calcified granuloma in the left lower lobe and associated calcified left hilar and mediastinal lymph nodes consistent with remote granulomatous disease. Stable large hiatal hernia. ABDOMEN/PELVIS: No abnormal hypermetabolic activity within the liver, pancreas, adrenal glands, or spleen. No hypermetabolic lymph nodes in the abdomen or pelvis. 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 Stable scattered aortic calcifications. Stable simple renal cysts not requiring any further imaging evaluation or follow-up. Small duodenal diverticulum. SKELETON: No findings osseous metastatic disease. Incidental CT findings: None. IMPRESSION: 1. Status post resection of the left parotid gland neoplasm. No findings for residual or recurrent tumor. 2. Resolution of the hypermetabolic left neck lymph nodes. 3. No findings for metastatic disease involving the chest, abdomen/pelvis or bony structures. 4. Stable large hiatal hernia. 5. 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 Aortic Atherosclerosis (ICD10-I70.0). Electronically Signed   By: Rudie Meyer M.D.   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  Biopsy confirmed squamous cell carcinoma.  PET scan results from April 04, 2023 reviewed independently with hypermetabolism in left parotid gland as well as several cervical lymph nodes.  Patient received his sixth and final cycle of cisplatin on June 12, 2023 and completed XRT on June 21, 2023.  Cisplatin was discontinued secondary to increasing creatinine.  No further intervention is needed.  Return to clinic in 2 months with repeat imaging using PET scan and further evaluation.  Patient will also need to return to ENT for follow-up evaluation.  Hypokalemia: Chronic and unchanged.  Continue oral potassium supplementation.  Patient declined IV replacement today.  He was also previously given dietary recommendations.   Hypomagnesia: Magnesium improved to 1.6.  Recommended oral magnesium supplementation. Renal insufficiency: Creatinine improved to 1.33.  He does not require IV fluids today.   Anemia: Improving, monitor.  Patient expressed understanding and was in agreement with this plan. He also understands that He can call clinic at any time with any questions, concerns, or complaints.    Cancer Staging  Primary squamous cell carcinoma of parotid gland [facility]) Staging form: Major Salivary Glands, AJCC 8th Edition - Clinical stage from 04/15/2023: Stage IVA (cT2, cN2b, cM0) - Signed by Jeralyn Ruths, MD on 04/15/2023   Jeralyn Ruths, MD   09/30/2023 11:13 AM

## 2023-10-01 ENCOUNTER — Encounter: Payer: Self-pay | Admitting: Oncology

## 2023-10-01 ENCOUNTER — Other Ambulatory Visit: Payer: Self-pay

## 2023-11-28 ENCOUNTER — Encounter: Payer: Self-pay | Admitting: Radiation Oncology

## 2023-11-28 ENCOUNTER — Ambulatory Visit
Admission: RE | Admit: 2023-11-28 | Discharge: 2023-11-28 | Disposition: A | Discharge status: Home / Self Care | Payer: Medicare PPO | Source: Ambulatory Visit | Attending: Radiation Oncology | Admitting: Radiation Oncology

## 2023-11-28 VITALS — BP 139/77 | HR 70 | Temp 98.0°F | Resp 18 | Ht 72.0 in | Wt 274.3 lb

## 2023-11-28 DIAGNOSIS — C07 Malignant neoplasm of parotid gland: Secondary | ICD-10-CM | POA: Diagnosis present

## 2023-11-28 DIAGNOSIS — Z923 Personal history of irradiation: Secondary | ICD-10-CM | POA: Diagnosis not present

## 2023-11-28 DIAGNOSIS — Z9221 Personal history of antineoplastic chemotherapy: Secondary | ICD-10-CM | POA: Insufficient documentation

## 2023-11-28 DIAGNOSIS — K118 Other diseases of salivary glands: Secondary | ICD-10-CM

## 2023-11-28 NOTE — Addendum Note (Signed)
Encounter addended by: [name], MD on: 11/28/2023 11:43 AM  Actions taken: Level of Service modified

## 2023-11-28 NOTE — Progress Notes (Signed)
Radiation Oncology Follow up Note  Name: Douglas Daugherty   Date:   11/28/2023 MRN:  [id number] DOB: 15-Jul-1956    This 67 y.o. 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  Seen today in routine follow-up he is doing fairly well he states his taste is returned pretty much to normal does have a dry mouth does use Biotene as well as drinks significant mounts of water.  He is having no head and neck pain or dysphagia.  He had a PET CT scan back in October showing no evidence of persistent disease..  COMPLICATIONS OF TREATMENT: none  FOLLOW UP COMPLIANCE: keeps appointments   PHYSICAL EXAM:  BP 139/77   Pulse 70   Temp 98 F (36.7 C)   Resp 18   Ht 6' (1.829 m)   Wt 274 lb 4.8 oz (124.4 kg)   BMI 37.20 kg/m  Bilateral parotid regions are negative for any evidence of mass or nodularity no evidence of adenopathy in the head and neck region are noted.  Well-developed well-nourished patient in NAD. HEENT reveals PERLA, EOMI, discs not visualized.  Oral cavity is clear. No oral mucosal lesions are identified. Neck is clear without evidence of cervical or supraclavicular adenopathy. Lungs are clear to A&P. Cardiac examination is essentially unremarkable with regular rate and rhythm without murmur rub or thrill. Abdomen is benign with no organomegaly or masses noted. Motor sensory and DTR levels are equal and symmetric in the upper and lower extremities. Cranial nerves II through XII are grossly intact. Proprioception is intact. No peripheral adenopathy or edema is identified. No  motor or sensory levels are noted. Crude visual fields are within normal range.  RADIOLOGY RESULTS: PET CT scan reviewed compatible with above-stated findings.  PLAN: Present time patient is doing well with no evidence of disease now out 5 months from concurrent chemoradiation.  He will see Dr. Orlie Dakin in February I have asked to see him back in 6 months for follow-up.  Patient knows to call with any concerns.  I would like to take this opportunity to thank you for allowing me to participate in the care of your patient.[name], MD

## 2023-11-30 ENCOUNTER — Other Ambulatory Visit: Payer: Self-pay

## 2024-03-27 ENCOUNTER — Other Ambulatory Visit: Payer: Self-pay | Admitting: *Deleted

## 2024-03-27 DIAGNOSIS — C07 Malignant neoplasm of parotid gland: Secondary | ICD-10-CM

## 2024-03-30 ENCOUNTER — Encounter: Payer: Self-pay | Admitting: Oncology

## 2024-03-30 ENCOUNTER — Inpatient Hospital Stay: Payer: Medicare PPO | Admitting: Oncology

## 2024-03-30 ENCOUNTER — Inpatient Hospital Stay: Payer: Medicare PPO | Attending: Oncology

## 2024-03-30 VITALS — BP 167/92 | HR 70 | Temp 97.8°F | Resp 20 | Wt 287.9 lb

## 2024-03-30 DIAGNOSIS — E876 Hypokalemia: Secondary | ICD-10-CM | POA: Insufficient documentation

## 2024-03-30 DIAGNOSIS — C07 Malignant neoplasm of parotid gland: Secondary | ICD-10-CM

## 2024-03-30 DIAGNOSIS — Z85858 Personal history of malignant neoplasm of other endocrine glands: Secondary | ICD-10-CM | POA: Insufficient documentation

## 2024-03-30 DIAGNOSIS — Z808 Family history of malignant neoplasm of other organs or systems: Secondary | ICD-10-CM | POA: Diagnosis not present

## 2024-03-30 DIAGNOSIS — Z923 Personal history of irradiation: Secondary | ICD-10-CM | POA: Insufficient documentation

## 2024-03-30 DIAGNOSIS — I129 Hypertensive chronic kidney disease with stage 1 through stage 4 chronic kidney disease, or unspecified chronic kidney disease: Secondary | ICD-10-CM | POA: Diagnosis not present

## 2024-03-30 DIAGNOSIS — N189 Chronic kidney disease, unspecified: Secondary | ICD-10-CM | POA: Diagnosis not present

## 2024-03-30 DIAGNOSIS — Z9221 Personal history of antineoplastic chemotherapy: Secondary | ICD-10-CM | POA: Insufficient documentation

## 2024-03-30 LAB — CBC WITH DIFFERENTIAL/PLATELET
Abs Immature Granulocytes: 0.04 10*3/uL (ref 0.00–0.07)
Basophils Absolute: 0.1 10*3/uL (ref 0.0–0.1)
Basophils Relative: 1 %
Eosinophils Absolute: 0.2 10*3/uL (ref 0.0–0.5)
Eosinophils Relative: 3 %
HCT: 45.3 % (ref 39.0–52.0)
Hemoglobin: 15.6 g/dL (ref 13.0–17.0)
Immature Granulocytes: 1 %
Lymphocytes Relative: 13 %
Lymphs Abs: 0.8 10*3/uL (ref 0.7–4.0)
MCH: 33.1 pg (ref 26.0–34.0)
MCHC: 34.4 g/dL (ref 30.0–36.0)
MCV: 96.2 fL (ref 80.0–100.0)
Monocytes Absolute: 0.6 10*3/uL (ref 0.1–1.0)
Monocytes Relative: 9 %
Neutro Abs: 4.9 10*3/uL (ref 1.7–7.7)
Neutrophils Relative %: 73 %
Platelets: 212 10*3/uL (ref 150–400)
RBC: 4.71 MIL/uL (ref 4.22–5.81)
RDW: 13.3 % (ref 11.5–15.5)
WBC: 6.6 10*3/uL (ref 4.0–10.5)
nRBC: 0 % (ref 0.0–0.2)

## 2024-03-30 LAB — BASIC METABOLIC PANEL - CANCER CENTER ONLY
Anion gap: 10 (ref 5–15)
BUN: 22 mg/dL (ref 8–23)
CO2: 29 mmol/L (ref 22–32)
Calcium: 9 mg/dL (ref 8.9–10.3)
Chloride: 98 mmol/L (ref 98–111)
Creatinine: 1.37 mg/dL — ABNORMAL HIGH (ref 0.61–1.24)
GFR, Estimated: 56 mL/min — ABNORMAL LOW (ref >60)
Glucose, Bld: 137 mg/dL — ABNORMAL HIGH (ref 70–99)
Potassium: 3.3 mmol/L — ABNORMAL LOW (ref 3.5–5.1)
Sodium: 137 mmol/L (ref 135–145)

## 2024-03-30 LAB — MAGNESIUM: Magnesium: 1.6 mg/dL — ABNORMAL LOW (ref 1.7–2.4)

## 2024-03-30 NOTE — Progress Notes (Signed)
 Survivorship Care Plan visit completed.  Treatment summary reviewed and given to patient.  ASCO answers booklet reviewed and given to patient.  CARE program and Cancer Transitions discussed with patient along with other resources cancer center offers to patients and caregivers.  Patient verbalized understanding.

## 2024-03-30 NOTE — Progress Notes (Signed)
 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  INTERVAL HISTORY: Patient returns to clinic today for repeat laboratory work and routine 51-month evaluation.  He continues to have mild dry mouth, but otherwise feels well.  He denies any dysphagia.  He has no neurologic complaints.  He denies any recent fevers or illnesses.  He has a good appetite.  He has no chest pain, shortness of breath, cough, or hemoptysis.  He denies any nausea, vomiting, constipation, or diarrhea.  He has no urinary complaints.  Patient offers no further specific complaints today.  REVIEW OF SYSTEMS:   Review of Systems  Constitutional: Negative.  Negative for fever, malaise/fatigue and weight loss.  Respiratory: Negative.  Negative for cough, hemoptysis and shortness of breath.   Cardiovascular: Negative.  Negative for chest pain and leg swelling.  Gastrointestinal: Negative.  Negative for abdominal pain.  Genitourinary: Negative.  Negative for dysuria.  Musculoskeletal: Negative.  Negative for back pain.  Skin: Negative.  Negative for rash.  Neurological: Negative.  Negative for dizziness, focal weakness, weakness and headaches.  Psychiatric/Behavioral: Negative.  The patient is not nervous/anxious.     As per HPI. Otherwise, a complete review of systems is negative.  PAST MEDICAL HISTORY: Past Medical History:  Diagnosis Date   GERD (gastroesophageal reflux disease)    Gout    Hypertension    PVC (premature ventricular contraction)    Sleep apnea    Squamous cell carcinoma of face     PAST SURGICAL HISTORY: Past Surgical History:   Procedure Laterality Date   KNEE SURGERY  1974   PARATHYROIDECTOMY Left 03/29/2020   Procedure: PARATHYROIDECTOMY;  Surgeon: Linus Salmons, MD;  Location: [facility] ORS;  Service: ENT;  Laterality: Left;    FAMILY HISTORY: Family History  Problem Relation Age of Onset   Hypertension Mother    Cancer Mother        skin cancer   Hypertension Father    Cancer Father        skin   Cancer Sister        skin cancer    ADVANCED DIRECTIVES (Y/N):  N  HEALTH MAINTENANCE: Social History   Tobacco Use   Smoking status: Never   Smokeless tobacco: Never  Substance Use Topics   Alcohol use: Yes    Comment: occasional   Drug use: Never     Colonoscopy:  PAP:  Bone density:  Lipid panel:  No Known Allergies  Current Outpatient Medications  Medication Sig Dispense Refill   acetaminophen (TYLENOL) 500 MG tablet Take 1,000 mg by mouth every 6 (six) hours as needed for moderate pain or headache.     allopurinol (ZYLOPRIM) 100 MG tablet Take 100 mg by mouth daily.     Carboxymethylcellulose Sodium (ARTIFICIAL TEARS OP) Place 1 drop into both eyes daily as needed (dry eyes).     carvedilol (COREG) 25 MG tablet Take 25 mg by mouth 2 (two) times daily.     chlorthalidone (HYGROTON) 25 MG tablet Take 25 mg by mouth every morning.     colchicine 0.6 MG tablet Take 0.6 mg by mouth 2 (two) times daily as needed (gout flare).  LORazepam (ATIVAN) 0.5 MG tablet Take 0.5 mg by mouth at bedtime as needed for sleep.     Magnesium Oxide (MAG-OXIDE PO) Take 750 mg by mouth.     Multiple Vitamin (MULTIVITAMIN WITH MINERALS) TABS tablet Take 1 tablet by mouth daily.     naproxen sodium (ALEVE) 220 MG tablet Take 220 mg by mouth 2 (two) times daily as needed (pain).     niacinamide 100 MG tablet Take 100 mg by mouth 2 (two) times daily with a meal.     ofloxacin (OCUFLOX) 0.3 % ophthalmic solution Place 2 drops into both eyes daily as needed (infection).     olmesartan (BENICAR) 40 MG tablet Take  40 mg by mouth daily.     omeprazole (PRILOSEC) 20 MG capsule Take 20 mg by mouth daily.     ondansetron (ZOFRAN) 8 MG tablet Take 1 tablet (8 mg total) by mouth every 8 (eight) hours as needed for nausea or vomiting. Start on the third day after cisplatin. 30 tablet 1   potassium chloride SA (KLOR-CON M) 20 MEQ tablet Take 1 tablet (20 mEq total) by mouth 2 (two) times daily. 60 tablet 1   prednisoLONE acetate (PRED FORTE) 1 % ophthalmic suspension Place 1 drop into both eyes daily as needed (allergies).     prochlorperazine (COMPAZINE) 10 MG tablet Take 1 tablet (10 mg total) by mouth every 6 (six) hours as needed (Nausea or vomiting). 30 tablet 1   losartan (COZAAR) 100 MG tablet Take 100 mg by mouth daily. (Patient not taking: Reported on 03/30/2024)     No current facility-administered medications for this visit.    OBJECTIVE: Vitals:   03/30/24 1022  BP: (!) 167/92  Pulse: 70  Resp: 20  Temp: 97.8 F (36.6 C)  SpO2: 100%       Body mass index is 39.05 kg/m.    ECOG FS:0 - Asymptomatic  General: Well-developed, well-nourished, no acute distress. Eyes: Pink conjunctiva, anicteric sclera. HEENT: Normocephalic, moist mucous membranes. Lungs: No audible wheezing or coughing. Heart: Regular rate and rhythm. Abdomen: Soft, nontender, no obvious distention. Musculoskeletal: No edema, cyanosis, or clubbing. Neuro: Alert, answering all questions appropriately. Cranial nerves grossly intact. Skin: No rashes or petechiae noted.  Psych: Normal affect.   LAB RESULTS:  Lab Results  Component Value Date   NA 137 03/30/2024   K 3.3 (L) 03/30/2024   CL 98 03/30/2024   CO2 29 03/30/2024   GLUCOSE 137 (H) 03/30/2024   BUN 22 03/30/2024   CREATININE 1.37 (H) 03/30/2024   CALCIUM 9.0 03/30/2024   GFRNONAA 56 (L) 03/30/2024   GFRAA >60 03/29/2020    Lab Results  Component Value Date   WBC 6.6 03/30/2024   NEUTROABS 4.9 03/30/2024   HGB 15.6 03/30/2024   HCT 45.3 03/30/2024    MCV 96.2 03/30/2024   PLT 212 03/30/2024     STUDIES: No results found.  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  Cisplatin was discontinued secondary to increasing creatinine.  Restaging PET scan on September 30, 2023 reviewed independently and report as above with essentially complete metabolic response.  Patient last saw ENT on November 25, 2023 who reported no evidence of disease.  He has been instructed to keep his previously scheduled follow-up appoint with  them in approximately May/June 2025.  No further intervention is needed at this time.  Return to clinic in 6 months for routine evaluation. Hyponatremia: Resolved. Hypokalemia: Mild.  Patient was given dietary recommendations. Hypomagnesia: Mild, monitor. Renal insufficiency: Chronic and.  Patient's creatinine is 1.37. Hypertension: Patient's blood pressure remains moderately elevated today.  Continue monitoring and treatment per primary care. Squamous/basal cell carcinoma: Continue follow-up with dermatology as scheduled.  Patient expressed understanding and was in agreement with this plan. He also understands that He can call clinic at any time with any questions, concerns, or complaints.    Cancer Staging  Primary squamous cell carcinoma of parotid gland [facility]) Staging form: Major Salivary Glands, AJCC 8th Edition - Clinical stage from 04/15/2023: Stage IVA (cT2, cN2b, cM0) - Signed by Shellie Dials, MD on 04/15/2023   Shellie Dials, MD   03/30/2024 11:14 AM

## 2024-03-31 ENCOUNTER — Other Ambulatory Visit: Payer: Self-pay

## 2024-05-08 LAB — BASIC METABOLIC PANEL WITH GFR
BUN: 22 — AB (ref 4–21)
CO2: 20 (ref 13–22)
Chloride: 99 (ref 99–108)
Creatinine: 1.6 — AB (ref 0.6–1.3)
Glucose: 143
Potassium: 3.8 meq/L (ref 3.5–5.1)
Sodium: 141 (ref 137–147)

## 2024-05-08 LAB — HEMOGLOBIN A1C: Hemoglobin A1C: 6.1

## 2024-05-08 LAB — LIPID PANEL
Cholesterol: 168 (ref 0–200)
HDL: 36 (ref 35–70)
LDL Cholesterol: 97
Triglycerides: 203 — AB (ref 40–160)

## 2024-05-08 LAB — COMPREHENSIVE METABOLIC PANEL WITH GFR: Calcium: 9.2 (ref 8.7–10.7)

## 2024-05-14 ENCOUNTER — Other Ambulatory Visit: Payer: Self-pay

## 2024-05-24 ENCOUNTER — Other Ambulatory Visit: Payer: Self-pay

## 2024-06-04 ENCOUNTER — Ambulatory Visit
Admission: RE | Admit: 2024-06-04 | Discharge: 2024-06-04 | Disposition: A | Discharge status: Home / Self Care | Payer: Medicare PPO | Source: Ambulatory Visit | Attending: Radiation Oncology | Admitting: Radiation Oncology

## 2024-06-04 ENCOUNTER — Encounter: Payer: Self-pay | Admitting: Radiation Oncology

## 2024-06-04 VITALS — BP 128/80 | HR 68 | Resp 20 | Wt 282.0 lb

## 2024-06-04 DIAGNOSIS — C07 Malignant neoplasm of parotid gland: Secondary | ICD-10-CM | POA: Insufficient documentation

## 2024-06-04 DIAGNOSIS — Z85828 Personal history of other malignant neoplasm of skin: Secondary | ICD-10-CM | POA: Insufficient documentation

## 2024-06-04 DIAGNOSIS — K118 Other diseases of salivary glands: Secondary | ICD-10-CM

## 2024-06-04 DIAGNOSIS — Z923 Personal history of irradiation: Secondary | ICD-10-CM | POA: Diagnosis not present

## 2024-06-04 NOTE — Progress Notes (Signed)
 Radiation Oncology Follow up Note  Name: Douglas Daugherty   Date:   06/04/2024 MRN:  [id number] DOB: [birth year]    This 68 y.o. 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  REFERRING PROVIDER: Westley Hammers, MD  HPI: Patient is a 68 year old male now out close to 1 year after completed concurrent chemoradiation therapy for locally advanced squamous cell carcinoma most likely of skin origin with left parotid involvement as well as level 2 3 and 5 lymph nodes.  He is seen today in routine follow-up he said multiple skin cancers removed by dermatology on his face and shoulder.  His taste has returned he is having no head and neck pain or dysphagia.  His last PET scan was back in October 2024 which I have reviewed showing no findings of residual or recurrent disease.  All resolution of hypermetabolic lymph nodes in the neck were noted.  No findings for metastatic disease.  He is continuing follow-up with ENT as well as medical oncology. COMPLICATIONS OF TREATMENT: none  FOLLOW UP COMPLIANCE: keeps appointments   PHYSICAL EXAM:  BP 128/80   Pulse 68   Resp 20   Wt 282 lb (127.9 kg)   BMI 38.25 kg/m  Multiple areas of resected squamous cell carcinomas of the face and upper torso.  Oral cavity is clear no evidence of mass or nodularity in the parotid glands are noted.  No evidence of adenopathy in the neck is noted.  Well-developed well-nourished patient in NAD. HEENT reveals PERLA, EOMI, discs not visualized.  Oral cavity is clear. No oral mucosal lesions are identified. Neck is clear without evidence of cervical or supraclavicular adenopathy. Lungs are clear to A&P. Cardiac examination is essentially unremarkable with regular rate and rhythm without murmur rub or thrill. Abdomen is  benign with no organomegaly or masses noted. Motor sensory and DTR levels are equal and symmetric in the upper and lower extremities. Cranial nerves II through XII are grossly intact. Proprioception is intact. No peripheral adenopathy or edema is identified. No motor or sensory levels are noted. Crude visual fields are within normal range.  RADIOLOGY RESULTS: PET CT scans reviewed compatible with above-stated findings  PLAN: Present time patient is doing well now out 1 year with no evidence of disease.  I am going to turn follow-up care over to ENT as well as medical oncology.  They are seeing him on a biannual basis.  I have also informed the patient should he have any other skin cancers that are in areas which would cause significant deformity radiation therapy is an excellent means to alleviate skin cancer in the facial region.  Patient knows to call with any concerns at any time.  I anticipate another PET scan in the next 6 months.  I would like to take this opportunity to thank you for allowing me to participate in the care of your patient.Glenis Langdon, MD

## 2024-08-14 ENCOUNTER — Ambulatory Visit: Admitting: Family Medicine

## 2024-08-14 ENCOUNTER — Encounter: Payer: Self-pay | Admitting: Family Medicine

## 2024-08-14 VITALS — BP 160/100 | HR 66 | Temp 97.6°F | Ht 72.0 in | Wt 286.0 lb

## 2024-08-14 DIAGNOSIS — M1A00X0 Idiopathic chronic gout, unspecified site, without tophus (tophi): Secondary | ICD-10-CM | POA: Diagnosis not present

## 2024-08-14 DIAGNOSIS — G4733 Obstructive sleep apnea (adult) (pediatric): Secondary | ICD-10-CM

## 2024-08-14 DIAGNOSIS — I10 Essential (primary) hypertension: Secondary | ICD-10-CM | POA: Diagnosis not present

## 2024-08-14 LAB — MICROALBUMIN, URINE WAIVED
Creatinine, Urine Waived: 50 mg/dL (ref 10–300)
Microalb, Ur Waived: 150 mg/L — ABNORMAL HIGH (ref 0–19)
Microalb/Creat Ratio: >300 mg/g — ABNORMAL HIGH (ref <30)

## 2024-08-14 MED ORDER — ALLOPURINOL 100 MG PO TABS: 100.0000 mg | ORAL_TABLET | Freq: Every day | ORAL | 1 refills | Status: AC

## 2024-08-14 MED ORDER — OMEPRAZOLE 20 MG PO CPDR: 20.0000 mg | DELAYED_RELEASE_CAPSULE | Freq: Two times a day (BID) | ORAL | 0 refills | Status: DC

## 2024-08-14 MED ORDER — NIACINAMIDE 100 MG PO TABS: 100.0000 mg | ORAL_TABLET | Freq: Two times a day (BID) | ORAL | 1 refills | Status: AC

## 2024-08-14 MED ORDER — POTASSIUM CHLORIDE CRYS ER 20 MEQ PO TBCR: 20.0000 meq | EXTENDED_RELEASE_TABLET | Freq: Two times a day (BID) | ORAL | 1 refills | Status: DC

## 2024-08-14 MED ORDER — COLCHICINE 0.6 MG PO TABS: 0.6000 mg | ORAL_TABLET | Freq: Two times a day (BID) | ORAL | 0 refills | Status: DC | PRN

## 2024-08-14 MED ORDER — OLMESARTAN MEDOXOMIL 40 MG PO TABS: 40.0000 mg | ORAL_TABLET | Freq: Every day | ORAL | 1 refills | Status: AC

## 2024-08-14 MED ORDER — CHLORTHALIDONE 25 MG PO TABS: 25.0000 mg | ORAL_TABLET | Freq: Every morning | ORAL | 1 refills | Status: DC

## 2024-08-14 MED ORDER — CARVEDILOL 25 MG PO TABS: 25.0000 mg | ORAL_TABLET | Freq: Two times a day (BID) | ORAL | 1 refills | Status: AC

## 2024-08-14 NOTE — Progress Notes (Signed)
 BP (!) 160/100   Pulse 66   Temp 97.6 F (36.4 C) (Oral)   Ht 6' (1.829 m)   Wt 286 lb (129.7 kg)   SpO2 97%   BMI 38.79 kg/m    Subjective:    Patient ID: Douglas Daugherty, male    DOB: [birth year], 68 y.o.   MRN: [id number]  HPI: Douglas Daugherty is a 68 y.o. male  Chief Complaint  Patient presents with   Establish Care   Hypertension   HYPERTENSION  Hypertension status: uncontrolled  Satisfied with current treatment? yes Duration of hypertension: chronic BP monitoring frequency:  rarely BP medication side effects:  no Medication compliance: excellent compliance Previous BP meds: chlorthalidone , losartan, carvedilol  Aspirin: no Recurrent headaches: no Visual changes: no Palpitations: no Dyspnea: no Chest pain: no Lower extremity edema: no Dizzy/lightheaded: no  SLEEP APNEA Sleep apnea status: controlled Duration: chronic Satisfied with current treatment?:  yes CPAP use:  yes Sleep quality with CPAP use: excellent Treament compliance:excellent compliance Last sleep study: couple of years ago Treatments attempted: CPAP Wakes feeling refreshed:  yes Daytime hypersomnolence:  no Fatigue:  yes Insomnia:  no Good sleep hygiene:  yes Difficulty falling asleep:  no Difficulty staying asleep:  no Snoring bothers bed partner:  yes Observed apnea by bed partner: yes Obesity:  yes Hypertension: yes  Pulmonary hypertension:  no Coronary artery disease:  no  Clinical coverage for weight loss GLP's   Medication being dispensed is Zepbound  2 mL/28 days. Titration doses are 2 mL/28 days.   [x]  Product being prescribed is FDA approved for the indication, age, weight (if applicable) and not does not exceed dosing limits per the Prescribing Information per the clinical conditions for use.  [x]  Patient's baseline weight measured within the last 45 days as required by provider before dispensing.  [x]  Patient is new to therapy and One of the following:   [x]  The  beneficiary is 70 years of age or over and has ONE of the following:  [x]  A BMI greater than or equal to 30 kg/m2  [x]  A BMI greater than or equal to 27 kg/m2 with at least one weight-related comorbidity/risk factor/complication (i.e. hypertension, type 2 diabetes, obstructive sleep apnea, cardiovascular disease, dyslipidemia)  [x]   If patient has one weight-related comorbidity/risk factor/complication (i.e. hypertension, type 2 diabetes, obstructive sleep apnea, cardiovascular disease, dyslipidemia), please list  Patient suffers from weight-related comorbidity/risk factor/complication OSA, HTN  []  The beneficiary is 79 years of age or older with a BMI greater than or equal to 27 kg/m2 AND has established cardiovascular disease (CVD) defined as having a history of myocardial infarction, stroke, or symptomatic peripheral disease, to be documented on the PA form. AND  [x]  The beneficiary has participated in 6 months or greater of lifestyle modification and will continue while on medication including structured nutrition following provider recommend dietary modifications and physical activity, unless physical activity is not clinically appropriate at the time GLP1 therapy commences AND  [x]  The beneficiary will NOT be using the requested agent in combination with another GLP-1 receptor agonist agent AND  [x]  The beneficiary does NOT have any FDA-labeled contraindications to the requested agent, including pregnancy, lactation, history of medullary thyroid cancer or multiple endocrine neoplasia type II.   Last BMI/Weight/Height recorded Estimated body mass index is 38.79 kg/m as calculated from the following:   Height as of this encounter: 6' (1.829 m).   Weight as of this encounter: 286 lb (129.7 kg).  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   Resolved Ambulatory Problems    Diagnosis Date Noted   No Resolved Ambulatory Problems   Past Medical History:  Diagnosis Date   GERD (gastroesophageal reflux disease)    Gout    PVC (premature ventricular contraction)    Sleep apnea    Squamous cell carcinoma of face    Past Surgical History:  Procedure Laterality Date   KNEE SURGERY  1974   PARATHYROIDECTOMY Left 03/29/2020   Procedure: PARATHYROIDECTOMY;  Surgeon: Herminio Miu, MD;  Location: [facility] ORS;  Service: ENT;  Laterality: Left;   Outpatient Encounter Medications as of 08/14/2024  Medication Sig   acetaminophen  (TYLENOL ) 500 MG tablet Take 1,000 mg by mouth every 6 (six) hours as needed for moderate pain or headache.   Carboxymethylcellulose Sodium (ARTIFICIAL TEARS OP) Place 1 drop into both eyes daily as needed (dry eyes).   fluorouracil (EFUDEX) 5 % cream Apply topically 2 (two) times daily.   LORazepam (ATIVAN) 0.5 MG tablet Take 0.5 mg by mouth at bedtime as needed for sleep.   Magnesium  Glycinate 100 MG CAPS Take 2 tablets by mouth daily.   Multiple Vitamin (MULTIVITAMIN WITH MINERALS) TABS tablet Take 1 tablet by mouth daily.   naproxen sodium (ALEVE) 220 MG tablet Take 220 mg by mouth 2 (two) times daily as needed (pain).   ofloxacin (OCUFLOX) 0.3 % ophthalmic solution Place 2 drops into both eyes daily as needed (infection).   ondansetron  (ZOFRAN ) 8 MG tablet Take 1 tablet (8 mg total) by mouth every 8 (eight) hours as needed for nausea or vomiting. Start on the third day after cisplatin .   prednisoLONE acetate (PRED FORTE) 1 % ophthalmic suspension Place 1 drop into both eyes daily as needed (allergies).   prochlorperazine  (COMPAZINE ) 10 MG tablet Take 1 tablet (10 mg total) by mouth every 6 (six) hours as needed (Nausea or vomiting).   tirzepatide  (ZEPBOUND ) 2.5  MG/0.5ML Pen Inject 2.5 mg into the skin once a week.   [START ON 09/20/2024] tirzepatide  (ZEPBOUND ) 5 MG/0.5ML Pen Inject 5 mg into the skin once a week.   [DISCONTINUED] allopurinol  (ZYLOPRIM ) 100 MG tablet Take 100 mg by mouth daily.   [DISCONTINUED] carvedilol  (COREG ) 25 MG tablet Take 25 mg by mouth 2 (two) times daily.   [DISCONTINUED] chlorthalidone  (HYGROTON ) 25 MG tablet Take 25 mg by mouth every morning.   [DISCONTINUED] colchicine  0.6 MG tablet Take 0.6 mg by mouth 2 (two) times daily as needed (gout flare).    [DISCONTINUED] niacinamide  100 MG tablet Take 100 mg by mouth 2 (two) times daily with a meal.   [DISCONTINUED] olmesartan  (BENICAR ) 40 MG tablet Take 40 mg by mouth daily.   [DISCONTINUED] omeprazole  (PRILOSEC) 20 MG capsule Take 20 mg by mouth daily.   [DISCONTINUED] potassium chloride  SA (KLOR-CON  M) 20 MEQ tablet Take 1 tablet (20 mEq total) by mouth 2 (two) times daily.   allopurinol  (ZYLOPRIM ) 100 MG tablet Take 1 tablet (100 mg total) by mouth daily.   carvedilol  (COREG ) 25 MG tablet Take 1 tablet (25 mg total) by mouth 2 (two) times daily.   chlorthalidone  (HYGROTON ) 25 MG tablet Take 1 tablet (25 mg total) by mouth every  morning.   colchicine  0.6 MG tablet Take 1 tablet (0.6 mg total) by mouth 2 (two) times daily as needed (gout flare).   niacinamide  100 MG tablet Take 1 tablet (100 mg total) by mouth 2 (two) times daily with a meal.   olmesartan  (BENICAR ) 40 MG tablet Take 1 tablet (40 mg total) by mouth daily.   omeprazole  (PRILOSEC) 20 MG capsule Take 1 capsule (20 mg total) by mouth 2 (two) times daily before a meal.   potassium chloride  SA (KLOR-CON  M) 20 MEQ tablet Take 1 tablet (20 mEq total) by mouth 2 (two) times daily.   [DISCONTINUED] HYDROcodone -acetaminophen  (NORCO/VICODIN) 5-325 MG tablet Take 1 tablet by mouth.   [DISCONTINUED] losartan (COZAAR) 100 MG tablet Take 100 mg by mouth daily. (Patient not taking: Reported on 03/30/2024)   [DISCONTINUED] Magnesium   Oxide (MAG-OXIDE PO) Take 750 mg by mouth.   No facility-administered encounter medications on file as of 08/14/2024.   No Known Allergies  Social History   Socioeconomic History   Marital status: Widowed    Spouse name: Not on file   Number of children: Not on file   Years of education: Not on file   Highest education level: Professional school degree (e.g., MD, DDS, DVM, JD)  Occupational History   Not on file  Tobacco Use   Smoking status: Never   Smokeless tobacco: Never  Substance and Sexual Activity   Alcohol use: Yes    Comment: occasional   Drug use: Never   Sexual activity: Not on file  Other Topics Concern   Not on file  Social History Narrative   Not on file   Social Drivers of Health   Financial Resource Strain: Low Risk  (08/13/2024)   Overall Financial Resource Strain (CARDIA)    Difficulty of Paying Living Expenses: Not hard at all  Food Insecurity: No Food Insecurity (08/13/2024)   Hunger Vital Sign    Worried About Running Out of Food in the Last Year: Never true    Ran Out of Food in the Last Year: Never true  Transportation Needs: No Transportation Needs (08/13/2024)   PRAPARE - Administrator, Civil Service (Medical): No    Lack of Transportation (Non-Medical): No  Physical Activity: Insufficiently Active (08/13/2024)   Exercise Vital Sign    Days of Exercise per Week: 2 days    Minutes of Exercise per Session: 20 min  Stress: No Stress Concern Present (08/13/2024)   [employer] of Occupational Health - Occupational Stress Questionnaire    Feeling of Stress: Not at all  Social Connections: Moderately Isolated (08/13/2024)   Social Connection and Isolation Panel    Frequency of Communication with Friends and Family: More than three times a week    Frequency of Social Gatherings with Friends and Family: More than three times a week    Attends Religious Services: 1 to 4 times per year    Active Member of [employer] or Organizations: No     Attends Banker Meetings: Not on file    Marital Status: Widowed   Family History  Problem Relation Age of Onset   Hypertension Mother    Cancer Mother        skin cancer   Hypertension Father    Cancer Father        skin   Cancer Sister        skin cancer     Review of Systems  Constitutional: Negative.   Respiratory: Negative.  Cardiovascular: Negative.   Musculoskeletal: Negative.   Neurological: Negative.   Psychiatric/Behavioral: Negative.      Per HPI unless specifically indicated above     Objective:    BP (!) 160/100   Pulse 66   Temp 97.6 F (36.4 C) (Oral)   Ht 6' (1.829 m)   Wt 286 lb (129.7 kg)   SpO2 97%   BMI 38.79 kg/m   Wt Readings from Last 3 Encounters:  08/14/24 286 lb (129.7 kg)  06/04/24 282 lb (127.9 kg)  03/30/24 287 lb 14.4 oz (130.6 kg)    Physical Exam Vitals and nursing note reviewed.  Constitutional:      General: He is not in acute distress.    Appearance: Normal appearance. He is not ill-appearing, toxic-appearing or diaphoretic.  HENT:     Head: Normocephalic and atraumatic.     Right Ear: External ear normal.     Left Ear: External ear normal.     Nose: Nose normal.     Mouth/Throat:     Mouth: Mucous membranes are moist.     Pharynx: Oropharynx is clear.  Eyes:     General: No scleral icterus.       Right eye: No discharge.        Left eye: No discharge.     Extraocular Movements: Extraocular movements intact.     Conjunctiva/sclera: Conjunctivae normal.     Pupils: Pupils are equal, round, and reactive to light.  Cardiovascular:     Rate and Rhythm: Normal rate and regular rhythm.     Pulses: Normal pulses.     Heart sounds: Normal heart sounds. No murmur heard.    No friction rub. No gallop.  Pulmonary:     Effort: Pulmonary effort is normal. No respiratory distress.     Breath sounds: Normal breath sounds. No stridor. No wheezing, rhonchi or rales.  Chest:     Chest wall: No tenderness.   Musculoskeletal:        General: Normal range of motion.     Cervical back: Normal range of motion and neck supple.  Skin:    General: Skin is warm and dry.     Capillary Refill: Capillary refill takes less than 2 seconds.     Coloration: Skin is not jaundiced or pale.     Findings: No bruising, erythema, lesion or rash.  Neurological:     General: No focal deficit present.     Mental Status: He is alert and oriented to person, place, and time. Mental status is at baseline.  Psychiatric:        Mood and Affect: Mood normal.        Behavior: Behavior normal.        Thought Content: Thought content normal.        Judgment: Judgment normal.     Results for orders placed or performed in visit on 08/14/24  Basic metabolic panel with GFR   Collection Time: 05/08/24 12:00 AM  Result Value Ref Range   Glucose 143    BUN 22 (A) 4 - 21   CO2 20 13 - 22   Creatinine 1.6 (A) 0.6 - 1.3   Potassium 3.8 3.5 - 5.1 mEq/L   Sodium 141 137 - 147   Chloride 99 99 - 108  Comprehensive metabolic panel with GFR   Collection Time: 05/08/24 12:00 AM  Result Value Ref Range   Calcium 9.2 8.7 - 10.7  Lipid panel   Collection Time: 05/08/24  12:00 AM  Result Value Ref Range   Triglycerides 203 (A) 40 - 160   Cholesterol 168 0 - 200   HDL 36 35 - 70   LDL Cholesterol 97   Hemoglobin A1c   Collection Time: 05/08/24 12:00 AM  Result Value Ref Range   Hemoglobin A1C 6.1   Microalbumin, Urine Waived   Collection Time: 08/14/24  9:56 AM  Result Value Ref Range   Microalb, Ur Waived 150 (H) 0 - 19 mg/L   Creatinine, Urine Waived 50 10 - 300 mg/dL   Microalb/Creat Ratio >300 (H) <30 mg/g      Assessment & Plan:   Problem List Items Addressed This Visit       Cardiovascular and Mediastinum   Hypertension - Primary   Running high- will change from losartan to olmesartan  and recheck in 6-8 weeks. Call with any concerns. Continue carvedilol  and chlorthalidone .      Relevant Medications    carvedilol  (COREG ) 25 MG tablet   chlorthalidone  (HYGROTON ) 25 MG tablet   olmesartan  (BENICAR ) 40 MG tablet   Other Relevant Orders   Microalbumin, Urine Waived (Completed)     Respiratory   OSA on CPAP   Doing well on CPAP, using nightly. Would benefit from zepbound . Will start. Recheck 6-8 weeks.       Relevant Medications   tirzepatide  (ZEPBOUND ) 2.5 MG/0.5ML Pen   tirzepatide  (ZEPBOUND ) 5 MG/0.5ML Pen (Start on 09/20/2024)     Other   Idiopathic chronic gout without tophus   Tolerating medicine well. Continue current regimen. Labs drawn recently. Refills given.         Follow up plan: Return 6-8 weeks, for records release from Dr. Corlis.   >25 minutes spent with patient today

## 2024-08-21 NOTE — Progress Notes (Signed)
 [facility] [address] [place], [place] [address]  Pulmonary Sleep Medicine   Office Visit Note  Patient Name: Douglas Daugherty DOB: October 22, 1956 MRN [id number]    Chief Complaint: Obstructive Sleep Apnea visit  Brief History:  Douglas Daugherty is seen today for an annual follow up visit for APAP@ 14-20 cmH2O. The patient has a 16 year history of sleep apnea. Patient is using PAP nightly.  The patient feels rested after sleeping with PAP.  The patient reports benefiting from PAP use. Reported sleepiness is  improved and the Epworth Sleepiness Score is 0 out of 24. The patient does not take naps. The patient complains of the following: patient is interested in new pap machine. Patient's current unit is old and is currently out of warranty.  The compliance download shows 100% compliance with an average use time of 8 hours 19 minutes. The AHI is 0.7.  The patient does not complain of limb movements disrupting sleep. The patient continues to require PAP therapy in order to eliminate sleep apnea.   ROS  General: (-) fever, (-) chills, (-) night sweat Nose and Sinuses: (-) nasal stuffiness or itchiness, (-) postnasal drip, (-) nosebleeds, (-) sinus trouble. Mouth and Throat: (-) sore throat, (-) hoarseness. Neck: (-) swollen glands, (-) enlarged thyroid, (-) neck pain. Respiratory: - cough, - shortness of breath, - wheezing. Neurologic: - numbness, - tingling. Psychiatric: - anxiety, - depression   Current Medication: Outpatient Encounter Medications as of 08/24/2024  Medication Sig   acetaminophen  (TYLENOL ) 500 MG tablet Take 1,000 mg by mouth every 6 (six) hours as needed for moderate pain or headache.   allopurinol  (ZYLOPRIM ) 100 MG tablet Take 1 tablet (100 mg total) by mouth daily.   Carboxymethylcellulose Sodium (ARTIFICIAL TEARS OP) Place 1 drop into both eyes daily as needed (dry eyes).   carvedilol  (COREG ) 25 MG tablet Take 1 tablet (25 mg total) by mouth 2 (two) times daily.    chlorthalidone  (HYGROTON ) 25 MG tablet Take 1 tablet (25 mg total) by mouth every morning.   colchicine  0.6 MG tablet Take 1 tablet (0.6 mg total) by mouth 2 (two) times daily as needed (gout flare).   fluorouracil (EFUDEX) 5 % cream Apply topically 2 (two) times daily.   LORazepam (ATIVAN) 0.5 MG tablet Take 0.5 mg by mouth at bedtime as needed for sleep.   Magnesium  Glycinate 100 MG CAPS Take 2 tablets by mouth daily.   Multiple Vitamin (MULTIVITAMIN WITH MINERALS) TABS tablet Take 1 tablet by mouth daily.   naproxen sodium (ALEVE) 220 MG tablet Take 220 mg by mouth 2 (two) times daily as needed (pain).   niacinamide  100 MG tablet Take 1 tablet (100 mg total) by mouth 2 (two) times daily with a meal.   ofloxacin (OCUFLOX) 0.3 % ophthalmic solution Place 2 drops into both eyes daily as needed (infection).   olmesartan  (BENICAR ) 40 MG tablet Take 1 tablet (40 mg total) by mouth daily.   omeprazole  (PRILOSEC) 20 MG capsule Take 1 capsule (20 mg total) by mouth 2 (two) times daily before a meal.   ondansetron  (ZOFRAN ) 8 MG tablet Take 1 tablet (8 mg total) by mouth every 8 (eight) hours as needed for nausea or vomiting. Start on the third day after cisplatin .   potassium chloride  SA (KLOR-CON  M) 20 MEQ tablet Take 1 tablet (20 mEq total) by mouth 2 (two) times daily.   prednisoLONE acetate (PRED FORTE) 1 % ophthalmic suspension Place 1 drop into both eyes daily as  needed (allergies).   prochlorperazine  (COMPAZINE ) 10 MG tablet Take 1 tablet (10 mg total) by mouth every 6 (six) hours as needed (Nausea or vomiting).   tirzepatide  (ZEPBOUND ) 2.5 MG/0.5ML Pen Inject 2.5 mg into the skin once a week.   [START ON 09/20/2024] tirzepatide  (ZEPBOUND ) 5 MG/0.5ML Pen Inject 5 mg into the skin once a week.   No facility-administered encounter medications on file as of 08/24/2024.    Surgical History: Past Surgical History:  Procedure Laterality Date   KNEE SURGERY  1974   PARATHYROIDECTOMY Left 03/29/2020    Procedure: PARATHYROIDECTOMY;  Surgeon: Herminio Miu, MD;  Location: [facility] ORS;  Service: ENT;  Laterality: Left;    Medical History: Past Medical History:  Diagnosis Date   GERD (gastroesophageal reflux disease)    Gout    Hypertension    PVC (premature ventricular contraction)    Sleep apnea    Squamous cell carcinoma of face     Family History: Non contributory to the present illness  Social History: Social History   Socioeconomic History   Marital status: Widowed    Spouse name: Not on file   Number of children: Not on file   Years of education: Not on file   Highest education level: Professional school degree (e.g., MD, DDS, DVM, JD)  Occupational History   Not on file  Tobacco Use   Smoking status: Never   Smokeless tobacco: Never  Substance and Sexual Activity   Alcohol use: Yes    Comment: occasional   Drug use: Never   Sexual activity: Not on file  Other Topics Concern   Not on file  Social History Narrative   Not on file   Social Drivers of Health   Financial Resource Strain: Low Risk  (08/13/2024)   Overall Financial Resource Strain (CARDIA)    Difficulty of Paying Living Expenses: Not hard at all  Food Insecurity: No Food Insecurity (08/13/2024)   Hunger Vital Sign    Worried About Running Out of Food in the Last Year: Never true    Ran Out of Food in the Last Year: Never true  Transportation Needs: No Transportation Needs (08/13/2024)   PRAPARE - Administrator, Civil Service (Medical): No    Lack of Transportation (Non-Medical): No  Physical Activity: Insufficiently Active (08/13/2024)   Exercise Vital Sign    Days of Exercise per Week: 2 days    Minutes of Exercise per Session: 20 min  Stress: No Stress Concern Present (08/13/2024)   [employer] of Occupational Health - Occupational Stress Questionnaire    Feeling of Stress: Not at all  Social Connections: Moderately Isolated (08/13/2024)   Social Connection and Isolation  Panel    Frequency of Communication with Friends and Family: More than three times a week    Frequency of Social Gatherings with Friends and Family: More than three times a week    Attends Religious Services: 1 to 4 times per year    Active Member of [employer] or Organizations: No    Attends Banker Meetings: Not on file    Marital Status: Widowed  Intimate Partner Violence: Not At Risk (04/25/2023)   Humiliation, Afraid, Rape, and Kick questionnaire    Fear of Current or Ex-Partner: No    Emotionally Abused: No    Physically Abused: No    Sexually Abused: No    Vital Signs: Blood pressure (!) 191/125, pulse 69, resp. rate 16, height 6' (1.829 m), weight 288 lb (130.6  kg), SpO2 97%. Body mass index is 39.06 kg/m.    Examination: General Appearance: The patient is well-developed, well-nourished, and in no distress. Neck Circumference: 47 cm Skin: Gross inspection of skin unremarkable. Head: normocephalic, no gross deformities. Eyes: no gross deformities noted. ENT: ears appear grossly normal Neurologic: Alert and oriented. No involuntary movements.  STOP BANG RISK ASSESSMENT S (snore) Have you been told that you snore?     NO   T (tired) Are you often tired, fatigued, or sleepy during the day?   NO  O (obstruction) Do you stop breathing, choke, or gasp during sleep? NO   P (pressure) Do you have or are you being treated for high blood pressure? YES   B (BMI) Is your body index greater than 35 kg/m? YES   A (age) Are you 109 years old or older? YES   N (neck) Do you have a neck circumference greater than 16 inches?   YES   G (gender) Are you a male? YES   TOTAL STOP/BANG "YES" ANSWERS 5       A STOP-Bang score of 2 or less is considered low risk, and a score of 5 or more is high risk for having either moderate or severe OSA. For people who score 3 or 4, doctors may need to perform further assessment to determine how likely they are to have OSA.         EPWORTH  SLEEPINESS SCALE:  Scale:  (0)= no chance of dozing; (1)= slight chance of dozing; (2)= moderate chance of dozing; (3)= high chance of dozing  Chance  Situtation    Sitting and reading: 0    Watching TV: 0    Sitting Inactive in public: 0    As a passenger in car: 0      Lying down to rest: 0    Sitting and talking: 0    Sitting quielty after lunch: 0    In a car, stopped in traffic: 0   TOTAL SCORE:   0 out of 24    SLEEP STUDIES:  PSG (10/07/08) AHI 75.6, min SPO2 31%  Titraton (09/2008) CPAP@ 16 cmH2O   CPAP COMPLIANCE DATA:  Date Range: 08/22/2023-08/20/2024  Average Daily Use: 8 hours 19 minutes  Median Use: 8 hours 19 minutes  Compliance for > 4 Hours: 100%  AHI: 0.7 respiratory events per hour  Days Used: 364/365 days  Mask Leak: 3.7  95th Percentile Pressure: 17.7         LABS: Recent Results (from the past 2160 hours)  Microalbumin, Urine Waived     Status: Abnormal   Collection Time: 08/14/24  9:56 AM  Result Value Ref Range   Microalb, Ur Waived 150 (H) 0 - 19 mg/L   Creatinine, Urine Waived 50 10 - 300 mg/dL   Microalb/Creat Ratio >300 (H) <30 mg/g    Comment:                              Abnormal:       30 - 300                         High Abnormal:           >300     Radiology: No results found.  No results found.  No results found.    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 OSA on CPAP (Primary) The patient does tolerate PAP and reports  benefit from PAP use. His machine is out of warranty and needs replacement. The patient was reminded how to clean equipment and advised to replace supplies routinely. The  patient was also counselled on weight loss. He is trying to pursue zepbound , copy of sleep study provided to patient. . The compliance is excellent. The AHI is 0.7.   OSA on cpap- controlled. Replace machine. Continue with excellent compliance with pap. CPAP continues to be medically necessary to treat this patient's OSA. F/u after setup.    2. CPAP use counseling CPAP Counseling: had a lengthy discussion with the patient regarding the importance of PAP therapy in management of the sleep apnea. Patient appears to understand the risk factor reduction and also understands the risks associated with untreated sleep apnea. Patient will try to make a good faith effort to remain compliant with therapy. Also instructed the patient on proper cleaning of the device including the water must be changed daily if possible and use of distilled water is preferred. Patient understands that the machine should be regularly cleaned with appropriate recommended cleaning solutions that do not damage the PAP machine for example given white vinegar and water rinses. Other methods such as ozone treatment may not be as good as these simple methods to achieve cleaning.   3. Obesity (BMI 30-39.9) Obesity Counseling: Had a lengthy discussion regarding patients BMI and weight issues. Patient was instructed on portion control as well as increased activity. Also discussed caloric restrictions with trying to maintain intake less than 2000 Kcal. Discussions were made in accordance with the 5As of weight management. Simple actions such as not eating late and if able to, taking a walk is suggested.      General Counseling: I have discussed the findings of the evaluation and examination with Francis.  I have also discussed any further diagnostic evaluation thatmay be needed or ordered today. Eldor verbalizes understanding of the findings of todays visit. We also reviewed his medications today and discussed drug interactions and side effects  including but not limited excessive drowsiness and altered mental states. We also discussed that there is always a risk not just to him but also people around him. he has been encouraged to call the office with any questions or concerns that should arise related to todays visit.  No orders of the defined types were placed in this encounter.       I have personally obtained a history, examined the patient, evaluated laboratory and imaging results, formulated the assessment and plan and placed orders. This patient was seen today by Lauraine Lay, PA-C in collaboration with Dr. Elfreda Bathe.   Elfreda DELENA Bathe, MD [facility] Diplomate ABMS Pulmonary Critical Care Medicine and Sleep Medicine

## 2024-08-23 DIAGNOSIS — M1A00X0 Idiopathic chronic gout, unspecified site, without tophus (tophi): Secondary | ICD-10-CM | POA: Insufficient documentation

## 2024-08-23 MED ORDER — ZEPBOUND 5 MG/0.5ML ~~LOC~~ SOAJ
5.0000 mg | SUBCUTANEOUS | 0 refills | Status: DC
  Filled 2024-08-23 – 2024-09-17 (×2): qty 2, 28d supply, fill #0

## 2024-08-23 MED ORDER — ZEPBOUND 2.5 MG/0.5ML ~~LOC~~ SOAJ
2.5000 mg | SUBCUTANEOUS | 0 refills | Status: DC
Start: 2024-08-23 — End: 2024-09-29
  Filled 2024-08-23 – 2024-08-27 (×2): qty 2, 28d supply, fill #0

## 2024-08-23 NOTE — Assessment & Plan Note (Signed)
 Running high- will change from losartan to olmesartan  and recheck in 6-8 weeks. Call with any concerns. Continue carvedilol  and chlorthalidone .

## 2024-08-23 NOTE — Assessment & Plan Note (Signed)
 Tolerating medicine well. Continue current regimen. Labs drawn recently. Refills given.

## 2024-08-23 NOTE — Assessment & Plan Note (Signed)
 Doing well on CPAP, using nightly. Would benefit from zepbound . Will start. Recheck 6-8 weeks.

## 2024-08-24 ENCOUNTER — Encounter: Payer: Self-pay | Admitting: Oncology

## 2024-08-24 ENCOUNTER — Encounter: Payer: Self-pay | Admitting: Family Medicine

## 2024-08-24 ENCOUNTER — Other Ambulatory Visit: Payer: Self-pay

## 2024-08-24 ENCOUNTER — Ambulatory Visit (INDEPENDENT_AMBULATORY_CARE_PROVIDER_SITE_OTHER): Admitting: Internal Medicine

## 2024-08-24 VITALS — BP 191/125 | HR 69 | Resp 16 | Ht 72.0 in | Wt 288.0 lb

## 2024-08-24 DIAGNOSIS — Z7189 Other specified counseling: Secondary | ICD-10-CM | POA: Diagnosis not present

## 2024-08-24 DIAGNOSIS — G4733 Obstructive sleep apnea (adult) (pediatric): Secondary | ICD-10-CM | POA: Diagnosis not present

## 2024-08-24 DIAGNOSIS — E669 Obesity, unspecified: Secondary | ICD-10-CM

## 2024-08-24 NOTE — Patient Instructions (Signed)

## 2024-08-27 ENCOUNTER — Telehealth: Payer: Self-pay

## 2024-08-27 ENCOUNTER — Other Ambulatory Visit: Payer: Self-pay

## 2024-08-27 ENCOUNTER — Other Ambulatory Visit (HOSPITAL_COMMUNITY): Payer: Self-pay

## 2024-08-27 NOTE — Telephone Encounter (Signed)
 Pharmacy Patient Advocate Encounter   Received notification from Onbase that prior authorization for Zepbound  2.5MG /0.5ML pen-injectors is required/requested.   Insurance verification completed.   The patient is insured through [employer] .   Per test claim: PA required and submitted KEY/EOC/Request #: [id number] from 12/18/2023 to 12/16/2024. Ran test claim, Copay is $64.00. This test claim was processed through [facility]- copay amounts may vary at other pharmacies due to pharmacy/plan contracts, or as the patient moves through the different stages of their insurance plan.

## 2024-08-31 ENCOUNTER — Other Ambulatory Visit (HOSPITAL_COMMUNITY): Payer: Self-pay

## 2024-09-01 DIAGNOSIS — D044 Carcinoma in situ of skin of scalp and neck: Secondary | ICD-10-CM | POA: Diagnosis not present

## 2024-09-01 DIAGNOSIS — C4442 Squamous cell carcinoma of skin of scalp and neck: Secondary | ICD-10-CM | POA: Diagnosis not present

## 2024-09-15 DIAGNOSIS — G4733 Obstructive sleep apnea (adult) (pediatric): Secondary | ICD-10-CM | POA: Diagnosis not present

## 2024-09-17 ENCOUNTER — Other Ambulatory Visit: Payer: Self-pay

## 2024-09-28 ENCOUNTER — Other Ambulatory Visit: Payer: Self-pay

## 2024-09-28 DIAGNOSIS — C07 Malignant neoplasm of parotid gland: Secondary | ICD-10-CM

## 2024-09-29 ENCOUNTER — Encounter: Payer: Self-pay | Admitting: Oncology

## 2024-09-29 ENCOUNTER — Inpatient Hospital Stay: Attending: Oncology

## 2024-09-29 ENCOUNTER — Inpatient Hospital Stay: Admitting: Oncology

## 2024-09-29 VITALS — BP 122/60 | HR 78 | Temp 97.8°F | Resp 18 | Ht 72.0 in | Wt 269.0 lb

## 2024-09-29 DIAGNOSIS — Z808 Family history of malignant neoplasm of other organs or systems: Secondary | ICD-10-CM | POA: Diagnosis not present

## 2024-09-29 DIAGNOSIS — I10 Essential (primary) hypertension: Secondary | ICD-10-CM | POA: Diagnosis not present

## 2024-09-29 DIAGNOSIS — R682 Dry mouth, unspecified: Secondary | ICD-10-CM | POA: Diagnosis not present

## 2024-09-29 DIAGNOSIS — Z923 Personal history of irradiation: Secondary | ICD-10-CM | POA: Diagnosis not present

## 2024-09-29 DIAGNOSIS — C07 Malignant neoplasm of parotid gland: Secondary | ICD-10-CM

## 2024-09-29 DIAGNOSIS — Z9221 Personal history of antineoplastic chemotherapy: Secondary | ICD-10-CM | POA: Insufficient documentation

## 2024-09-29 DIAGNOSIS — E871 Hypo-osmolality and hyponatremia: Secondary | ICD-10-CM | POA: Insufficient documentation

## 2024-09-29 DIAGNOSIS — Z85828 Personal history of other malignant neoplasm of skin: Secondary | ICD-10-CM | POA: Diagnosis not present

## 2024-09-29 DIAGNOSIS — N289 Disorder of kidney and ureter, unspecified: Secondary | ICD-10-CM | POA: Diagnosis not present

## 2024-09-29 DIAGNOSIS — Z85858 Personal history of malignant neoplasm of other endocrine glands: Secondary | ICD-10-CM | POA: Insufficient documentation

## 2024-09-29 LAB — CBC WITH DIFFERENTIAL (CANCER CENTER ONLY)
Abs Immature Granulocytes: 0.04 K/uL (ref 0.00–0.07)
Basophils Absolute: 0 K/uL (ref 0.0–0.1)
Basophils Relative: 1 %
Eosinophils Absolute: 0.2 K/uL (ref 0.0–0.5)
Eosinophils Relative: 2 %
HCT: 45.3 % (ref 39.0–52.0)
Hemoglobin: 15.7 g/dL (ref 13.0–17.0)
Immature Granulocytes: 1 %
Lymphocytes Relative: 7 %
Lymphs Abs: 0.6 K/uL — ABNORMAL LOW (ref 0.7–4.0)
MCH: 32.6 pg (ref 26.0–34.0)
MCHC: 34.7 g/dL (ref 30.0–36.0)
MCV: 94.2 fL (ref 80.0–100.0)
Monocytes Absolute: 0.9 K/uL (ref 0.1–1.0)
Monocytes Relative: 11 %
Neutro Abs: 6.3 K/uL (ref 1.7–7.7)
Neutrophils Relative %: 78 %
Platelet Count: 290 K/uL (ref 150–400)
RBC: 4.81 MIL/uL (ref 4.22–5.81)
RDW: 13.1 % (ref 11.5–15.5)
WBC Count: 8 K/uL (ref 4.0–10.5)
nRBC: 0 % (ref 0.0–0.2)

## 2024-09-29 LAB — CMP (CANCER CENTER ONLY)
ALT: 25 U/L (ref 0–44)
AST: 27 U/L (ref 15–41)
Albumin: 4.1 g/dL (ref 3.5–5.0)
Alkaline Phosphatase: 90 U/L (ref 38–126)
Anion gap: 11 (ref 5–15)
BUN: 38 mg/dL — ABNORMAL HIGH (ref 8–23)
CO2: 27 mmol/L (ref 22–32)
Calcium: 9.3 mg/dL (ref 8.9–10.3)
Chloride: 94 mmol/L — ABNORMAL LOW (ref 98–111)
Creatinine: 1.79 mg/dL — ABNORMAL HIGH (ref 0.61–1.24)
GFR, Estimated: 40 mL/min — ABNORMAL LOW (ref >60)
Glucose, Bld: 123 mg/dL — ABNORMAL HIGH (ref 70–99)
Potassium: 3.9 mmol/L (ref 3.5–5.1)
Sodium: 132 mmol/L — ABNORMAL LOW (ref 135–145)
Total Bilirubin: 1.1 mg/dL (ref 0.0–1.2)
Total Protein: 7.4 g/dL (ref 6.5–8.1)

## 2024-09-29 LAB — MAGNESIUM: Magnesium: 1.7 mg/dL (ref 1.7–2.4)

## 2024-09-29 NOTE — Progress Notes (Signed)
 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  INTERVAL HISTORY: Patient returns to clinic today for repeat laboratory work and routine 41-month evaluation.  He recently initiated Zepbound  and has lost over 20 pounds.  He currently feels well and is asymptomatic.  He continues to have occasional dry mouth, but denies any dysphagia.  He has no neurologic complaints.  He denies any recent fevers or illnesses.  He has a good appetite.  He has no chest pain, shortness of breath, cough, or hemoptysis.  He denies any nausea, vomiting, constipation, or diarrhea.  He has no urinary complaints.  Patient offers no further specific complaints today.  REVIEW OF SYSTEMS:   Review of Systems  Constitutional: Negative.  Negative for fever, malaise/fatigue and weight loss.  Respiratory: Negative.  Negative for cough, hemoptysis and shortness of breath.   Cardiovascular: Negative.  Negative for chest pain and leg swelling.  Gastrointestinal: Negative.  Negative for abdominal pain.  Genitourinary: Negative.  Negative for dysuria.  Musculoskeletal: Negative.  Negative for back pain.  Skin: Negative.  Negative for rash.  Neurological: Negative.  Negative for dizziness, focal weakness, weakness and headaches.  Psychiatric/Behavioral: Negative.  The patient is not nervous/anxious.     As per HPI. Otherwise, a complete review of systems is negative.  PAST MEDICAL HISTORY: Past Medical History:  Diagnosis Date   GERD (gastroesophageal reflux disease)    Gout    Hypertension    PVC (premature ventricular contraction)    Sleep apnea     Squamous cell carcinoma of face     PAST SURGICAL HISTORY: Past Surgical History:  Procedure Laterality Date   KNEE SURGERY  1974   PARATHYROIDECTOMY Left 03/29/2020   Procedure: PARATHYROIDECTOMY;  Surgeon: Herminio Miu, MD;  Location: [facility] ORS;  Service: ENT;  Laterality: Left;    FAMILY HISTORY: Family History  Problem Relation Age of Onset   Hypertension Mother    Cancer Mother        skin cancer   Hypertension Father    Cancer Father        skin   Cancer Sister        skin cancer    ADVANCED DIRECTIVES (Y/N):  N  HEALTH MAINTENANCE: Social History   Tobacco Use   Smoking status: Never   Smokeless tobacco: Never  Substance Use Topics   Alcohol use: Yes    Comment: occasional   Drug use: Never     Colonoscopy:  PAP:  Bone density:  Lipid panel:  No Known Allergies  Current Outpatient Medications  Medication Sig Dispense Refill   acetaminophen  (TYLENOL ) 500 MG tablet Take 1,000 mg by mouth every 6 (six) hours as needed for moderate pain or headache.     allopurinol  (ZYLOPRIM ) 100 MG tablet Take 1 tablet (100 mg total) by mouth daily. 90 tablet 1   Carboxymethylcellulose Sodium (ARTIFICIAL TEARS OP) Place 1 drop into both eyes daily as needed (dry eyes).     carvedilol  (COREG ) 25 MG tablet Take 1 tablet (25 mg total) by mouth 2 (two) times daily. 180 tablet 1   chlorthalidone  (HYGROTON ) 25 MG tablet Take 1 tablet (25 mg total) by mouth every morning.  90 tablet 1   colchicine  0.6 MG tablet Take 1 tablet (0.6 mg total) by mouth 2 (two) times daily as needed (gout flare). 30 tablet 0   LORazepam (ATIVAN) 0.5 MG tablet Take 0.5 mg by mouth at bedtime as needed for sleep.     Magnesium  Glycinate 100 MG CAPS Take 2 tablets by mouth daily.     Multiple Vitamin (MULTIVITAMIN WITH MINERALS) TABS tablet Take 1 tablet by mouth daily.     naproxen sodium (ALEVE) 220 MG tablet Take 220 mg by mouth 2 (two) times daily as needed (pain).     niacinamide  100 MG tablet Take  1 tablet (100 mg total) by mouth 2 (two) times daily with a meal. 180 tablet 1   ofloxacin (OCUFLOX) 0.3 % ophthalmic solution Place 2 drops into both eyes daily as needed (infection).     olmesartan  (BENICAR ) 40 MG tablet Take 1 tablet (40 mg total) by mouth daily. 90 tablet 1   omeprazole  (PRILOSEC) 20 MG capsule Take 1 capsule (20 mg total) by mouth 2 (two) times daily before a meal. 180 capsule 0   potassium chloride  SA (KLOR-CON  M) 20 MEQ tablet Take 1 tablet (20 mEq total) by mouth 2 (two) times daily. 180 tablet 1   tirzepatide  (ZEPBOUND ) 5 MG/0.5ML Pen Inject 5 mg into the skin once a week. 2 mL 0   fluorouracil (EFUDEX) 5 % cream Apply topically 2 (two) times daily. (Patient not taking: Reported on 09/29/2024)     prednisoLONE acetate (PRED FORTE) 1 % ophthalmic suspension Place 1 drop into both eyes daily as needed (allergies). (Patient not taking: Reported on 09/29/2024)     No current facility-administered medications for this visit.    OBJECTIVE: Vitals:   09/29/24 1040  BP: 122/60  Pulse: 78  Resp: 18  Temp: 97.8 F (36.6 C)  SpO2: 100%       Body mass index is 36.48 kg/m.    ECOG FS:0 - Asymptomatic  General: Well-developed, well-nourished, no acute distress. Eyes: Pink conjunctiva, anicteric sclera. HEENT: Normocephalic, moist mucous membranes.  No palpable lymphadenopathy. Lungs: No audible wheezing or coughing. Heart: Regular rate and rhythm. Abdomen: Soft, nontender, no obvious distention. Musculoskeletal: No edema, cyanosis, or clubbing. Neuro: Alert, answering all questions appropriately. Cranial nerves grossly intact. Skin: No rashes or petechiae noted. Psych: Normal affect.  LAB RESULTS:  Lab Results  Component Value Date   NA 132 (L) 09/29/2024   K 3.9 09/29/2024   CL 94 (L) 09/29/2024   CO2 27 09/29/2024   GLUCOSE 123 (H) 09/29/2024   BUN 38 (H) 09/29/2024   CREATININE 1.79 (H) 09/29/2024   CALCIUM 9.3 09/29/2024   PROT 7.4 09/29/2024    ALBUMIN 4.1 09/29/2024   AST 27 09/29/2024   ALT 25 09/29/2024   ALKPHOS 90 09/29/2024   BILITOT 1.1 09/29/2024   GFRNONAA 40 (L) 09/29/2024   GFRAA >60 03/29/2020    Lab Results  Component Value Date   WBC 8.0 09/29/2024   NEUTROABS 6.3 09/29/2024   HGB 15.7 09/29/2024   HCT 45.3 09/29/2024   MCV 94.2 09/29/2024   PLT 290 09/29/2024     STUDIES: No results found.  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  Cisplatin  was discontinued secondary to increasing creatinine.  Restaging PET scan on September 30, 2023 reviewed independently and report as above with essentially complete metabolic response.  Patient last saw ENT in June 2025 who recommended repeat 1 final PET scan in October 2025.  No further intervention is needed.  Return to clinic in 6 months with repeat laboratory work and further evaluation at which point can consider switching patient to yearly visits.  Hyponatremia: Mild, monitor.   Hypokalemia: Resolved. Hypomagnesia: Resolved. Renal insufficiency: He has trended up slightly to 1.79.  Monitor. Hypertension: Patient's blood pressure is now within normal limits.   Squamous/basal cell carcinoma of skin: Continue follow-up with dermatology as scheduled.  Patient expressed understanding and was in agreement with this plan. He also understands that He can call clinic at any time with any questions, concerns, or complaints.    Cancer Staging  Primary squamous cell carcinoma of parotid gland [facility]) Staging form: Major Salivary Glands, AJCC 8th Edition - Clinical stage from 04/15/2023: Stage IVA (cT2, cN2b, cM0) - Signed by Jacobo Evalene PARAS, MD on 04/15/2023   Evalene PARAS Jacobo, MD   09/29/2024 11:04 AM

## 2024-09-29 NOTE — Progress Notes (Signed)
 Patient is doing good, he has been on Zepbound , which is helping his BP, weight, and his A1C.

## 2024-09-30 ENCOUNTER — Other Ambulatory Visit: Payer: Self-pay

## 2024-10-02 ENCOUNTER — Other Ambulatory Visit: Payer: Self-pay

## 2024-10-02 ENCOUNTER — Ambulatory Visit: Admitting: Family Medicine

## 2024-10-02 ENCOUNTER — Encounter: Payer: Self-pay | Admitting: Family Medicine

## 2024-10-02 VITALS — BP 160/70 | HR 80 | Ht 72.0 in | Wt 268.0 lb

## 2024-10-02 DIAGNOSIS — I10 Essential (primary) hypertension: Secondary | ICD-10-CM | POA: Diagnosis not present

## 2024-10-02 DIAGNOSIS — S41101A Unspecified open wound of right upper arm, initial encounter: Secondary | ICD-10-CM

## 2024-10-02 DIAGNOSIS — Z23 Encounter for immunization: Secondary | ICD-10-CM | POA: Diagnosis not present

## 2024-10-02 DIAGNOSIS — G4733 Obstructive sleep apnea (adult) (pediatric): Secondary | ICD-10-CM | POA: Diagnosis not present

## 2024-10-02 MED ORDER — ZEPBOUND 7.5 MG/0.5ML ~~LOC~~ SOAJ: 7.5000 mg | SUBCUTANEOUS | 1 refills | Status: DC

## 2024-10-02 MED ORDER — POTASSIUM CHLORIDE CRYS ER 20 MEQ PO TBCR: 20.0000 meq | EXTENDED_RELEASE_TABLET | Freq: Every day | ORAL | 1 refills | Status: AC

## 2024-10-02 MED ORDER — ZEPBOUND 7.5 MG/0.5ML ~~LOC~~ SOAJ
7.5000 mg | SUBCUTANEOUS | 1 refills | Status: DC
  Filled 2024-10-02: qty 2, 28d supply, fill #0
  Filled 2024-10-23: qty 2, 28d supply, fill #1

## 2024-10-02 NOTE — Assessment & Plan Note (Signed)
 Running high here, but low at home in the 90s/50s occasionally- given 20lb weight loss, will stop chlorthalidone  and cut his potassium to 1x a day- get back for BMP in 2 weeks to see if we need to stop potassium all together. Continue to monitor at home. Follow up 6 weeks.

## 2024-10-02 NOTE — Progress Notes (Signed)
 BP (!) 160/70   Pulse 80   Ht 6' (1.829 m)   Wt 268 lb (121.6 kg)   SpO2 97%   BMI 36.35 kg/m    Subjective:    Patient ID: Douglas Daugherty, male    DOB: 06-May-1956, 68 y.o.   MRN: [id number]  HPI: Douglas Daugherty is a 68 y.o. male  Chief Complaint  Patient presents with   Hypertension    BP avg @ home 100-125/75-80 Bp this morning115/73   Obesity   HYPERTENSION  Hypertension status: high here but low at home  Satisfied with current treatment? yes Duration of hypertension: chronic BP monitoring frequency:  a few times a week BP range: 100-125/75-80, has had a few lows BP medication side effects:  no Medication compliance: excellent compliance Previous BP meds: olmesartan , chlorthalidone , carvedilol  Aspirin: no Recurrent headaches: no Visual changes: no Palpitations: no Dyspnea: no Chest pain: no Lower extremity edema: no Dizzy/lightheaded: yes  OBESITY Duration: chronic Previous attempts at weight loss: yes Complications of obesity: OSA, HTN, gout  Peak weight: 288lbs Weight loss goal: to be healthy Weight loss to date: 20lbs  Requesting obesity pharmacotherapy: yes Current weight loss supplements/medications: yes Previous weight loss supplements/meds: no  Relevant past medical, surgical, family and social history reviewed and updated as indicated. Interim medical history since our last visit reviewed. Allergies and medications reviewed and updated.  Review of Systems  Constitutional: Negative.   Respiratory: Negative.    Cardiovascular: Negative.   Musculoskeletal: Negative.   Neurological: Negative.   Psychiatric/Behavioral: Negative.      Per HPI unless specifically indicated above     Objective:    BP (!) 160/70   Pulse 80   Ht 6' (1.829 m)   Wt 268 lb (121.6 kg)   SpO2 97%   BMI 36.35 kg/m   Wt Readings from Last 3 Encounters:  10/02/24 268 lb (121.6 kg)  09/29/24 269 lb (122 kg)  08/24/24 288 lb (130.6 kg)    Physical Exam Vitals  and nursing note reviewed.  Constitutional:      General: He is not in acute distress.    Appearance: Normal appearance. He is obese. He is not ill-appearing, toxic-appearing or diaphoretic.  HENT:     Head: Normocephalic and atraumatic.     Right Ear: External ear normal.     Left Ear: External ear normal.     Nose: Nose normal.     Mouth/Throat:     Mouth: Mucous membranes are moist.     Pharynx: Oropharynx is clear.  Eyes:     General: No scleral icterus.       Right eye: No discharge.        Left eye: No discharge.     Extraocular Movements: Extraocular movements intact.     Conjunctiva/sclera: Conjunctivae normal.     Pupils: Pupils are equal, round, and reactive to light.  Cardiovascular:     Rate and Rhythm: Normal rate and regular rhythm.     Pulses: Normal pulses.     Heart sounds: Normal heart sounds. No murmur heard.    No friction rub. No gallop.  Pulmonary:     Effort: Pulmonary effort is normal. No respiratory distress.     Breath sounds: Normal breath sounds. No stridor. No wheezing, rhonchi or rales.  Chest:     Chest wall: No tenderness.  Musculoskeletal:        General: Normal range of motion.     Cervical back: Normal  range of motion and neck supple.  Skin:    General: Skin is warm and dry.     Capillary Refill: Capillary refill takes less than 2 seconds.     Coloration: Skin is not jaundiced or pale.     Findings: No bruising, erythema, lesion or rash.  Neurological:     General: No focal deficit present.     Mental Status: He is alert and oriented to person, place, and time. Mental status is at baseline.  Psychiatric:        Mood and Affect: Mood normal.        Behavior: Behavior normal.        Thought Content: Thought content normal.        Judgment: Judgment normal.     Results for orders placed or performed in visit on 09/29/24  CMP (Cancer Center only)   Collection Time: 09/29/24  9:48 AM  Result Value Ref Range   Sodium 132 (L) 135 - 145  mmol/L   Potassium 3.9 3.5 - 5.1 mmol/L   Chloride 94 (L) 98 - 111 mmol/L   CO2 27 22 - 32 mmol/L   Glucose, Bld 123 (H) 70 - 99 mg/dL   BUN 38 (H) 8 - 23 mg/dL   Creatinine 8.20 (H) 9.38 - 1.24 mg/dL   Calcium 9.3 8.9 - 89.6 mg/dL   Total Protein 7.4 6.5 - 8.1 g/dL   Albumin 4.1 3.5 - 5.0 g/dL   AST 27 15 - 41 U/L   ALT 25 0 - 44 U/L   Alkaline Phosphatase 90 38 - 126 U/L   Total Bilirubin 1.1 0.0 - 1.2 mg/dL   GFR, Estimated 40 (L) >60 mL/min   Anion gap 11 5 - 15  CBC with Differential (Cancer Center Only)   Collection Time: 09/29/24  9:48 AM  Result Value Ref Range   WBC Count 8.0 4.0 - 10.5 K/uL   RBC 4.81 4.22 - 5.81 MIL/uL   Hemoglobin 15.7 13.0 - 17.0 g/dL   HCT 54.6 60.9 - 47.9 %   MCV 94.2 80.0 - 100.0 fL   MCH 32.6 26.0 - 34.0 pg   MCHC 34.7 30.0 - 36.0 g/dL   RDW 86.8 88.4 - 84.4 %   Platelet Count 290 150 - 400 K/uL   nRBC 0.0 0.0 - 0.2 %   Neutrophils Relative % 78 %   Neutro Abs 6.3 1.7 - 7.7 K/uL   Lymphocytes Relative 7 %   Lymphs Abs 0.6 (L) 0.7 - 4.0 K/uL   Monocytes Relative 11 %   Monocytes Absolute 0.9 0.1 - 1.0 K/uL   Eosinophils Relative 2 %   Eosinophils Absolute 0.2 0.0 - 0.5 K/uL   Basophils Relative 1 %   Basophils Absolute 0.0 0.0 - 0.1 K/uL   Immature Granulocytes 1 %   Abs Immature Granulocytes 0.04 0.00 - 0.07 K/uL  Magnesium    Collection Time: 09/29/24  9:48 AM  Result Value Ref Range   Magnesium  1.7 1.7 - 2.4 mg/dL      Assessment & Plan:   Problem List Items Addressed This Visit       Cardiovascular and Mediastinum   Hypertension   Running high here, but low at home in the 90s/50s occasionally- given 20lb weight loss, will stop chlorthalidone  and cut his potassium to 1x a day- get back for BMP in 2 weeks to see if we need to stop potassium all together. Continue to monitor at home. Follow up 6 weeks.  Relevant Orders   Basic metabolic panel with GFR   Magnesium      Respiratory   OSA on CPAP - Primary   Doing great  with the zepbound - down 20lbs for 6.9% of his body weight. Tolerating medicine well. Will continue titration of zepbound  and recheck in 6 weeks. Call with any concerns.       Other Visit Diagnoses       Open wound of right upper arm, initial encounter       Healing well. Due for Td- given today.   Relevant Orders   Td : Tetanus/diphtheria >7yo Preservative  free     Need for pneumococcal 20-valent conjugate vaccination       Given today.   Relevant Orders   Pneumococcal conjugate vaccine 20-valent (Prevnar 20)     Needs flu shot       Given today.   Relevant Orders   Flu vaccine HIGH DOSE PF(Fluzone Trivalent)        Follow up plan: Return 2 weeks for lab only visit, 6 weeks for 6 month follow up/AWV with me.

## 2024-10-02 NOTE — Assessment & Plan Note (Signed)
 Doing great with the zepbound - down 20lbs for 6.9% of his body weight. Tolerating medicine well. Will continue titration of zepbound  and recheck in 6 weeks. Call with any concerns.

## 2024-10-07 DIAGNOSIS — C44329 Squamous cell carcinoma of skin of other parts of face: Secondary | ICD-10-CM | POA: Diagnosis not present

## 2024-10-13 ENCOUNTER — Other Ambulatory Visit: Payer: Self-pay

## 2024-10-16 ENCOUNTER — Other Ambulatory Visit

## 2024-10-16 DIAGNOSIS — I10 Essential (primary) hypertension: Secondary | ICD-10-CM

## 2024-10-17 ENCOUNTER — Encounter: Payer: Self-pay | Admitting: Oncology

## 2024-10-17 LAB — BASIC METABOLIC PANEL WITH GFR
BUN/Creatinine Ratio: 17 (ref 10–24)
BUN: 24 mg/dL (ref 8–27)
CO2: 23 mmol/L (ref 20–29)
Calcium: 8.8 mg/dL (ref 8.6–10.2)
Chloride: 95 mmol/L — ABNORMAL LOW (ref 96–106)
Creatinine, Ser: 1.43 mg/dL — ABNORMAL HIGH (ref 0.76–1.27)
Glucose: 97 mg/dL (ref 70–99)
Potassium: 4 mmol/L (ref 3.5–5.2)
Sodium: 133 mmol/L — ABNORMAL LOW (ref 134–144)
eGFR: 53 mL/min/1.73 — ABNORMAL LOW (ref >59)

## 2024-10-17 LAB — MAGNESIUM: Magnesium: 1.9 mg/dL (ref 1.6–2.3)

## 2024-10-21 ENCOUNTER — Ambulatory Visit
Admission: RE | Admit: 2024-10-21 | Discharge: 2024-10-21 | Disposition: A | Discharge status: Home / Self Care | Source: Ambulatory Visit | Attending: Oncology | Admitting: Oncology

## 2024-10-21 DIAGNOSIS — I700 Atherosclerosis of aorta: Secondary | ICD-10-CM | POA: Insufficient documentation

## 2024-10-21 DIAGNOSIS — I728 Aneurysm of other specified arteries: Secondary | ICD-10-CM | POA: Insufficient documentation

## 2024-10-21 DIAGNOSIS — K449 Diaphragmatic hernia without obstruction or gangrene: Secondary | ICD-10-CM | POA: Diagnosis not present

## 2024-10-21 DIAGNOSIS — J8410 Pulmonary fibrosis, unspecified: Secondary | ICD-10-CM | POA: Diagnosis not present

## 2024-10-21 DIAGNOSIS — C07 Malignant neoplasm of parotid gland: Secondary | ICD-10-CM | POA: Diagnosis not present

## 2024-10-21 LAB — GLUCOSE, CAPILLARY: Glucose-Capillary: 96 mg/dL (ref 70–99)

## 2024-10-21 MED ORDER — FLUDEOXYGLUCOSE F - 18 (FDG) INJECTION
12.0000 | Freq: Once | INTRAVENOUS | Status: AC | PRN
  Administered 2024-10-21: 11.95 via INTRAVENOUS

## 2024-10-23 ENCOUNTER — Other Ambulatory Visit: Payer: Self-pay

## 2024-10-26 ENCOUNTER — Ambulatory Visit: Payer: Self-pay | Admitting: Family Medicine

## 2024-10-28 ENCOUNTER — Encounter: Payer: Self-pay | Admitting: Family Medicine

## 2024-10-28 ENCOUNTER — Other Ambulatory Visit: Payer: Self-pay

## 2024-11-02 DIAGNOSIS — D2262 Melanocytic nevi of left upper limb, including shoulder: Secondary | ICD-10-CM | POA: Diagnosis not present

## 2024-11-02 DIAGNOSIS — D2261 Melanocytic nevi of right upper limb, including shoulder: Secondary | ICD-10-CM | POA: Diagnosis not present

## 2024-11-02 DIAGNOSIS — L570 Actinic keratosis: Secondary | ICD-10-CM | POA: Diagnosis not present

## 2024-11-02 DIAGNOSIS — D2272 Melanocytic nevi of left lower limb, including hip: Secondary | ICD-10-CM | POA: Diagnosis not present

## 2024-11-02 DIAGNOSIS — Z85828 Personal history of other malignant neoplasm of skin: Secondary | ICD-10-CM | POA: Diagnosis not present

## 2024-11-02 DIAGNOSIS — D485 Neoplasm of uncertain behavior of skin: Secondary | ICD-10-CM | POA: Diagnosis not present

## 2024-11-02 DIAGNOSIS — D225 Melanocytic nevi of trunk: Secondary | ICD-10-CM | POA: Diagnosis not present

## 2024-11-04 ENCOUNTER — Ambulatory Visit: Admitting: Family Medicine

## 2024-11-09 ENCOUNTER — Other Ambulatory Visit: Payer: Self-pay | Admitting: Family Medicine

## 2024-11-10 NOTE — Telephone Encounter (Signed)
 Requested Prescriptions  Pending Prescriptions Disp Refills   colchicine  0.6 MG tablet [Pharmacy Med Name: COLCHICINE  0.6 MG TAB] 30 tablet 2    Sig: TAKE ONE TABLET (0.6 MG) BY MOUTH TWICE DAILY AS NEEDED FOR GOUT FLARE     Endocrinology:  Gout Agents - colchicine  Failed - 11/10/2024 11:31 AM      Failed - Cr in normal range and within 360 days    Creatinine  Date Value Ref Range Status  09/29/2024 1.79 (H) 0.61 - 1.24 mg/dL Final   Creatinine, Ser  Date Value Ref Range Status  10/16/2024 1.43 (H) 0.76 - 1.27 mg/dL Final         Passed - ALT in normal range and within 360 days    ALT  Date Value Ref Range Status  09/29/2024 25 0 - 44 U/L Final         Passed - AST in normal range and within 360 days    AST  Date Value Ref Range Status  09/29/2024 27 15 - 41 U/L Final         Passed - Valid encounter within last 12 months    Recent Outpatient Visits           1 month ago OSA on CPAP   [facility] [place], Megan P, DO   2 months ago Primary hypertension    [facility] [place], Megan P, DO              Passed - CBC within normal limits and completed in the last 12 months    WBC  Date Value Ref Range Status  03/30/2024 6.6 4.0 - 10.5 K/uL Final   WBC Count  Date Value Ref Range Status  09/29/2024 8.0 4.0 - 10.5 K/uL Final   RBC  Date Value Ref Range Status  09/29/2024 4.81 4.22 - 5.81 MIL/uL Final   Hemoglobin  Date Value Ref Range Status  09/29/2024 15.7 13.0 - 17.0 g/dL Final   HCT  Date Value Ref Range Status  09/29/2024 45.3 39.0 - 52.0 % Final   MCHC  Date Value Ref Range Status  09/29/2024 34.7 30.0 - 36.0 g/dL Final   [facility]  Date Value Ref Range Status  09/29/2024 32.6 26.0 - 34.0 pg Final   MCV  Date Value Ref Range Status  09/29/2024 94.2 80.0 - 100.0 fL Final   No results found for: PLTCOUNTKUC, LABPLAT, POCPLA RDW  Date Value Ref Range Status  09/29/2024 13.1 11.5 - 15.5 % Final

## 2024-11-14 ENCOUNTER — Other Ambulatory Visit: Payer: Self-pay

## 2024-11-19 ENCOUNTER — Other Ambulatory Visit: Payer: Self-pay | Admitting: Family Medicine

## 2024-11-19 ENCOUNTER — Other Ambulatory Visit: Payer: Self-pay

## 2024-11-19 ENCOUNTER — Ambulatory Visit: Admitting: Family Medicine

## 2024-11-19 ENCOUNTER — Encounter: Payer: Self-pay | Admitting: Family Medicine

## 2024-11-19 VITALS — BP 169/90 | HR 82 | Temp 98.0°F | Ht 72.0 in | Wt 245.0 lb

## 2024-11-19 DIAGNOSIS — I10 Essential (primary) hypertension: Secondary | ICD-10-CM

## 2024-11-19 DIAGNOSIS — R7301 Impaired fasting glucose: Secondary | ICD-10-CM | POA: Insufficient documentation

## 2024-11-19 DIAGNOSIS — Z85818 Personal history of malignant neoplasm of other sites of lip, oral cavity, and pharynx: Secondary | ICD-10-CM | POA: Diagnosis not present

## 2024-11-19 DIAGNOSIS — G4733 Obstructive sleep apnea (adult) (pediatric): Secondary | ICD-10-CM | POA: Diagnosis not present

## 2024-11-19 DIAGNOSIS — E669 Obesity, unspecified: Secondary | ICD-10-CM

## 2024-11-19 DIAGNOSIS — Z114 Encounter for screening for human immunodeficiency virus [HIV]: Secondary | ICD-10-CM | POA: Diagnosis not present

## 2024-11-19 DIAGNOSIS — Z1159 Encounter for screening for other viral diseases: Secondary | ICD-10-CM | POA: Diagnosis not present

## 2024-11-19 DIAGNOSIS — Z1211 Encounter for screening for malignant neoplasm of colon: Secondary | ICD-10-CM

## 2024-11-19 DIAGNOSIS — I270 Primary pulmonary hypertension: Secondary | ICD-10-CM | POA: Insufficient documentation

## 2024-11-19 DIAGNOSIS — M1A00X0 Idiopathic chronic gout, unspecified site, without tophus (tophi): Secondary | ICD-10-CM

## 2024-11-19 DIAGNOSIS — Z0000 Encounter for general adult medical examination without abnormal findings: Secondary | ICD-10-CM

## 2024-11-19 DIAGNOSIS — D692 Other nonthrombocytopenic purpura: Secondary | ICD-10-CM | POA: Insufficient documentation

## 2024-11-19 DIAGNOSIS — R3911 Hesitancy of micturition: Secondary | ICD-10-CM

## 2024-11-19 DIAGNOSIS — I700 Atherosclerosis of aorta: Secondary | ICD-10-CM | POA: Insufficient documentation

## 2024-11-19 DIAGNOSIS — E7800 Pure hypercholesterolemia, unspecified: Secondary | ICD-10-CM | POA: Insufficient documentation

## 2024-11-19 DIAGNOSIS — I499 Cardiac arrhythmia, unspecified: Secondary | ICD-10-CM

## 2024-11-19 DIAGNOSIS — K449 Diaphragmatic hernia without obstruction or gangrene: Secondary | ICD-10-CM | POA: Insufficient documentation

## 2024-11-19 DIAGNOSIS — N1831 Chronic kidney disease, stage 3a: Secondary | ICD-10-CM | POA: Insufficient documentation

## 2024-11-19 DIAGNOSIS — C44320 Squamous cell carcinoma of skin of unspecified parts of face: Secondary | ICD-10-CM | POA: Insufficient documentation

## 2024-11-19 LAB — MICROALBUMIN, URINE WAIVED
Creatinine, Urine Waived: 100 mg/dL (ref 10–300)
Microalb, Ur Waived: 150 mg/L — ABNORMAL HIGH (ref 0–19)
Microalb/Creat Ratio: >300 mg/g — ABNORMAL HIGH (ref <30)

## 2024-11-19 LAB — BAYER DCA HB A1C WAIVED: HB A1C (BAYER DCA - WAIVED): 4.7 % — ABNORMAL LOW (ref 4.8–5.6)

## 2024-11-19 MED ORDER — AMLODIPINE BESYLATE 2.5 MG PO TABS: 2.5000 mg | ORAL_TABLET | Freq: Every day | ORAL | 0 refills | Status: AC

## 2024-11-19 MED ORDER — ZEPBOUND 10 MG/0.5ML ~~LOC~~ SOAJ
10.0000 mg | SUBCUTANEOUS | 3 refills | Status: DC
  Filled 2024-11-19: qty 2, 28d supply, fill #0
  Filled 2024-12-11: qty 2, 28d supply, fill #1

## 2024-11-19 NOTE — Patient Instructions (Signed)
 Preventative Services:  Health Risk Assessment and Personalized Prevention Plan: Done today Bone Mass Measurements: N/A CVD Screening: Done today Colon Cancer Screening:  Depression Screening: Done today Diabetes Screening: Done today Glaucoma Screening: see your eye doctor Hepatitis B vaccine: N/A Hepatitis C screening: Done today HIV Screening:done today Flu Vaccine: up to date Lung cancer Screening: N/A Obesity Screening: Done today Pneumonia Vaccines (2): up to date STI Screening: N/A PSA screening: Done today  Douglas Daugherty,  Thank you for taking the time for your Medicare Wellness Visit. I appreciate your continued commitment to your health goals. Please review the care plan we discussed, and feel free to reach out if I can assist you further.  Please note that Annual Wellness Visits do not include a physical exam. Some assessments may be limited, especially if the visit was conducted virtually. If needed, we may recommend an in-person follow-up with your provider.  Ongoing Care Seeing your primary care provider every 3 to 6 months helps us  monitor your health and provide consistent, personalized care.   Referrals If a referral was made during today's visit and you haven't received any updates within two weeks, please contact the referred provider directly to check on the status.  Recommended Screenings:  Health Maintenance  Topic Date Due   Medicare Annual Wellness Visit  Never done   Hepatitis C Screening  Never done   Zoster (Shingles) Vaccine (1 of 2) Never done   Colon Cancer Screening  Never done   DTaP/Tdap/Td vaccine (2 - Tdap) 10/02/2034   Pneumococcal Vaccine for age over 4  Completed   Flu Shot  Completed   Meningitis B Vaccine  Aged Out   COVID-19 Vaccine  Discontinued       11/19/2024   11:06 AM  Advanced Directives  Does Patient Have a Medical Advance Directive? No  Would patient like information on creating a medical advance directive? No - Patient  declined    Vision: Annual vision screenings are recommended for early detection of glaucoma, cataracts, and diabetic retinopathy. These exams can also reveal signs of chronic conditions such as diabetes and high blood pressure.  Dental: Annual dental screenings help detect early signs of oral cancer, gum disease, and other conditions linked to overall health, including heart disease and diabetes.  Please see the attached documents for additional preventive care recommendations.

## 2024-11-19 NOTE — Assessment & Plan Note (Signed)
 Congratulated patient on 45lb weight loss on zepbound ! Will push the dose and recheck in 3 months. Call with any concerns.

## 2024-11-19 NOTE — Assessment & Plan Note (Signed)
 Rechecking labs today. Await results. Treat as needed.

## 2024-11-19 NOTE — Assessment & Plan Note (Signed)
 Down 41lbs with zepbound ! Feeling well. Continue to monitor. Call with any concerns.

## 2024-11-19 NOTE — Assessment & Plan Note (Signed)
 Found incidentally on imaging. Continue to monitor. Call with any concerns.

## 2024-11-19 NOTE — Assessment & Plan Note (Signed)
 Found incidentally on imaging. Will keep BP under good control. Continue to monitor. Call with any concerns.

## 2024-11-19 NOTE — Progress Notes (Signed)
 Chief Complaint  Patient presents with   Medicare Wellness          Subjective:   Douglas Daugherty is a 68 y.o. male who presents for a Medicare Annual Wellness Visit.  Visit info / Clinical Intake: Medicare Wellness Visit Type:: Subsequent Annual Wellness Visit Persons participating in visit and providing information:: patient Medicare Wellness Visit Mode:: In-person (required for WTM) Interpreter Needed?: No Living arrangements:: (!) lives alone Patient's Overall Health Status Rating: very good Typical amount of pain: some Does pain affect daily life?: no Are you currently prescribed opioids?: no  Dietary Habits and Nutritional Risks How many meals a day?: 2 Eats fruit and vegetables daily?: yes Most meals are obtained by: preparing own meals In the last 2 weeks, have you had any of the following?: none Diabetic:: no  Functional Status Activities of Daily Living (to include ambulation/medication): Independent Ambulation: Independent Medication Administration: Independent Home Management (perform basic housework or laundry): Independent Manage your own finances?: yes Primary transportation is: driving Concerns about vision?: no *vision screening is required for WTM* Concerns about hearing?: no  Fall Screening Falls in the past year?: 0 Number of falls in past year: 0 Was there an injury with Fall?: 0 Fall Risk Category Calculator: 0 Patient Fall Risk Level: Low Fall Risk  Fall Risk Patient at Risk for Falls Due to: No Fall Risks Fall risk Follow up: Falls evaluation completed  Home and Transportation Safety: All rugs have non-skid backing?: yes All stairs or steps have railings?: N/A, no stairs Grab bars in the bathtub or shower?: yes Have non-skid surface in bathtub or shower?: yes Good home lighting?: yes Regular seat belt use?: yes Hospital stays in the last year:: no  Cognitive Assessment Difficulty concentrating, remembering, or making decisions? :  no Will 6CIT or Mini Cog be Completed: no 6CIT or Mini Cog Declined: patient alert, oriented, able to answer questions appropriately and recall recent events  Advance Directives (For Healthcare) Does Patient Have a Medical Advance Directive?: No Does patient want to make changes to medical advance directive?: No - Patient declined Type of Advance Directive: Healthcare Power of [place]; Living will Copy of Healthcare Power of Attorney in Chart?: No - copy requested Copy of Living Will in Chart?: No - copy requested Would patient like information on creating a medical advance directive?: No - Patient declined  Reviewed/Updated  Reviewed/Updated: Reviewed All (Medical, Surgical, Family, Medications, Allergies, Care Teams, Patient Goals)    Allergies (verified) Patient has no known allergies.   Current Medications (verified) Outpatient Encounter Medications as of 11/19/2024  Medication Sig   acetaminophen  (TYLENOL ) 500 MG tablet Take 1,000 mg by mouth every 6 (six) hours as needed for moderate pain or headache.   allopurinol  (ZYLOPRIM ) 100 MG tablet Take 1 tablet (100 mg total) by mouth daily.   Carboxymethylcellulose Sodium (ARTIFICIAL TEARS OP) Place 1 drop into both eyes daily as needed (dry eyes).   carvedilol  (COREG ) 25 MG tablet Take 1 tablet (25 mg total) by mouth 2 (two) times daily.   colchicine  0.6 MG tablet TAKE ONE TABLET (0.6 MG) BY MOUTH TWICE DAILY AS NEEDED FOR GOUT FLARE   LORazepam (ATIVAN) 0.5 MG tablet Take 0.5 mg by mouth at bedtime as needed for sleep.   Magnesium  Glycinate 100 MG CAPS Take 2 tablets by mouth daily.   Multiple Vitamin (MULTIVITAMIN WITH MINERALS) TABS tablet Take 1 tablet by mouth daily.   naproxen sodium (ALEVE) 220 MG tablet Take 220 mg by mouth  2 (two) times daily as needed (pain).   niacinamide  100 MG tablet Take 1 tablet (100 mg total) by mouth 2 (two) times daily with a meal.   ofloxacin (OCUFLOX) 0.3 % ophthalmic solution Place 2 drops into  both eyes daily as needed (infection).   olmesartan  (BENICAR ) 40 MG tablet Take 1 tablet (40 mg total) by mouth daily.   omeprazole  (PRILOSEC) 20 MG capsule Take 1 capsule (20 mg total) by mouth 2 (two) times daily before a meal.   potassium chloride  SA (KLOR-CON  M) 20 MEQ tablet Take 1 tablet (20 mEq total) by mouth daily.   tirzepatide  (ZEPBOUND ) 7.5 MG/0.5ML Pen Inject 7.5 mg into the skin once a week.   [DISCONTINUED] tirzepatide  (ZEPBOUND ) 5 MG/0.5ML Pen Inject 5 mg into the skin once a week.   No facility-administered encounter medications on file as of 11/19/2024.    History: Past Medical History:  Diagnosis Date   GERD (gastroesophageal reflux disease)    Gout    Hypertension    PVC (premature ventricular contraction)    Sleep apnea    Squamous cell carcinoma of face    Past Surgical History:  Procedure Laterality Date   KNEE SURGERY  1974   PARATHYROIDECTOMY Left 03/29/2020   Procedure: PARATHYROIDECTOMY;  Surgeon: Herminio Miu, MD;  Location: [facility] ORS;  Service: ENT;  Laterality: Left;   Family History  Problem Relation Age of Onset   Hypertension Mother    Cancer Mother        skin cancer   Hypertension Father    Cancer Father        skin   Cancer Sister        skin cancer   Social History   Occupational History   Not on file  Tobacco Use   Smoking status: Never   Smokeless tobacco: Never  Substance and Sexual Activity   Alcohol use: Yes    Comment: occasional   Drug use: Never   Sexual activity: Not on file   Tobacco Counseling Counseling given: Not Answered  SDOH Screenings   Food Insecurity: No Food Insecurity (08/13/2024)  Housing: Unknown (08/13/2024)  Transportation Needs: No Transportation Needs (08/13/2024)  Utilities: Not At Risk (04/25/2023)  Alcohol Screen: Low Risk  (08/13/2024)  Depression (PHQ2-9): Low Risk  (08/14/2024)  Financial Resource Strain: Low Risk  (08/13/2024)  Physical Activity: Insufficiently Active (08/13/2024)  Social  Connections: Moderately Isolated (08/13/2024)  Stress: No Stress Concern Present (08/13/2024)  Tobacco Use: Low Risk  (11/19/2024)   See flowsheets for full screening details  Depression Screen PHQ 2 & 9 Depression Scale- Over the past 2 weeks, how often have you been bothered by any of the following problems? Little interest or pleasure in doing things: 0 Feeling down, depressed, or hopeless (PHQ Adolescent also includes...irritable): 0 PHQ-2 Total Score: 0     Goals Addressed   None          Objective:    Today's Vitals   11/19/24 1104  BP: (!) 166/89  Pulse: 82  Temp: 98 F (36.7 C)  TempSrc: Oral  SpO2: 95%  Weight: 245 lb (111.1 kg)  Height: 6' (1.829 m)  PainSc: 1    Body mass index is 33.23 kg/m.  Hearing/Vision screen No results found. Immunizations and Health Maintenance Health Maintenance  Topic Date Due   Medicare Annual Wellness (AWV)  Never done   Hepatitis C Screening  Never done   Zoster Vaccines- Shingrix (1 of 2) Never done   Colonoscopy  Never done   DTaP/Tdap/Td (2 - Tdap) 10/02/2034   Pneumococcal Vaccine: 50+ Years  Completed   Influenza Vaccine  Completed   Meningococcal B Vaccine  Aged Out   COVID-19 Vaccine  Discontinued        Assessment/Plan:  This is a routine wellness examination for Yazen.  Patient Care Team: Vicci Duwaine SQUIBB, DO as PCP - General (Family Medicine) Jacobo Evalene PARAS, MD as Consulting Physician (Oncology) Lenn Aran, MD as Consulting Physician (Radiation Oncology)  I have personally reviewed and noted the following in the patient's chart:   Medical and social history Use of alcohol, tobacco or illicit drugs  Current medications and supplements including opioid prescriptions. Functional ability and status Nutritional status Physical activity Advanced directives List of other physicians Hospitalizations, surgeries, and ER visits in previous 12 months Vitals Screenings to include cognitive,  depression, and falls Referrals and appointments  Orders Placed This Encounter  Procedures   Comprehensive metabolic panel with GFR    Has the patient fasted?:   Yes   CBC with Differential/Platelet   Lipid Panel w/o Chol/HDL Ratio    Has the patient fasted?:   Yes   PSA   TSH   Hepatitis C Antibody   Microalbumin, Urine Waived   Uric acid   In addition, I have reviewed and discussed with patient certain preventive protocols, quality metrics, and best practice recommendations. A written personalized care plan for preventive services as well as general preventive health recommendations were provided to patient.   Andrue Dini, DO   11/19/2024   Return in about 3 months (around 02/17/2025).  After Visit Summary: (In Person-Printed) AVS printed and given to the patient

## 2024-11-19 NOTE — Assessment & Plan Note (Signed)
 Will keep his BP and cholesterol under good control. Continue to monitor. Call with any concerns.

## 2024-11-19 NOTE — Assessment & Plan Note (Signed)
 Scheduled for Mohs surgery in the new year. Continue to follow with dermatology. Call with any concerns.

## 2024-11-19 NOTE — Progress Notes (Signed)
 BP (!) 169/90   Pulse 82   Temp 98 F (36.7 C) (Oral)   Ht 6' (1.829 m)   Wt 245 lb (111.1 kg)   SpO2 95%   BMI 33.23 kg/m    Subjective:    Patient ID: Douglas Daugherty, male    DOB: 1956-07-03, 68 y.o.   MRN: [id number]  HPI: Douglas Daugherty is a 68 y.o. male presenting on 11/19/2024 for comprehensive medical examination. Current medical complaints include:  SLEEP APNEA Sleep apnea status: controlled Duration: chronic Satisfied with current treatment?:  yes CPAP use:  yes Sleep quality with CPAP use: excellent Treament compliance:excellent compliance Last sleep study: couple of years ago Treatments attempted: CPAP, zepbound  Wakes feeling refreshed:  yes Daytime hypersomnolence:  no Fatigue:  yes Insomnia:  no Good sleep hygiene:  yes Difficulty falling asleep:  no Difficulty staying asleep:  no Snoring bothers bed partner:  yes Observed apnea by bed partner: yes Obesity:  yes Hypertension: yes  Pulmonary hypertension:  no Coronary artery disease:  no  Had a bad gout flare at the end of the summer- has not had much of an issue since.   HYPERTENSION / HYPERLIPIDEMIA Satisfied with current treatment? yes Duration of hypertension: chronic BP monitoring frequency: rarely BP medication side effects: no Past BP meds: olmesartan , chlorthalidone , coreg  Duration of hyperlipidemia: chronic Cholesterol medication side effects: no Cholesterol supplements: none Past cholesterol medications: none Medication compliance: excellent Aspirin: no Recent stressors: no Recurrent headaches: no Visual changes: no Palpitations: no Dyspnea: no Chest pain: no Lower extremity edema: no Dizzy/lightheaded: no   He currently lives with: alone Interim Problems from his last visit: no  Functional Status Survey: Is the patient deaf or have difficulty hearing?: No Does the patient have difficulty seeing, even when wearing glasses/contacts?: No Does the patient have difficulty  concentrating, remembering, or making decisions?: No Does the patient have difficulty walking or climbing stairs?: No Does the patient have difficulty dressing or bathing?: No Does the patient have difficulty doing errands alone such as visiting a doctor's office or shopping?: No  FALL RISK:    11/19/2024   11:06 AM 08/14/2024    9:09 AM  Fall Risk   Falls in the past year? 0 0  Number falls in past yr: 0 0  Injury with Fall? 0 0   Risk for fall due to : No Fall Risks No Fall Risks  Follow up Falls evaluation completed      Data saved with a previous flowsheet row definition    Depression Screen    11/19/2024   11:32 AM 08/14/2024    9:09 AM 04/25/2023    2:57 PM  Depression screen PHQ 2/9  Decreased Interest 0 0 0  Down, Depressed, Hopeless 0 0 0  PHQ - 2 Score 0 0 0    Advanced Directives Does patient have a HCPOA?    no If yes, name and contact information:  Does patient have a living will or MOST form?  no  Past Medical History:  Past Medical History:  Diagnosis Date   GERD (gastroesophageal reflux disease)    Gout    Hypertension    PVC (premature ventricular contraction)    Sleep apnea    Squamous cell carcinoma of face     Surgical History:  Past Surgical History:  Procedure Laterality Date   KNEE SURGERY  1974   PARATHYROIDECTOMY Left 03/29/2020   Procedure: PARATHYROIDECTOMY;  Surgeon: Herminio Miu, MD;  Location: [facility] ORS;  Service: ENT;  Laterality: Left;    Medications:  Current Outpatient Medications on File Prior to Visit  Medication Sig   acetaminophen  (TYLENOL ) 500 MG tablet Take 1,000 mg by mouth every 6 (six) hours as needed for moderate pain or headache.   allopurinol  (ZYLOPRIM ) 100 MG tablet Take 1 tablet (100 mg total) by mouth daily.   Carboxymethylcellulose Sodium (ARTIFICIAL TEARS OP) Place 1 drop into both eyes daily as needed (dry eyes).   carvedilol  (COREG ) 25 MG tablet Take 1 tablet (25 mg total) by mouth 2 (two) times daily.    colchicine  0.6 MG tablet TAKE ONE TABLET (0.6 MG) BY MOUTH TWICE DAILY AS NEEDED FOR GOUT FLARE   LORazepam (ATIVAN) 0.5 MG tablet Take 0.5 mg by mouth at bedtime as needed for sleep.   Magnesium  Glycinate 100 MG CAPS Take 2 tablets by mouth daily.   Multiple Vitamin (MULTIVITAMIN WITH MINERALS) TABS tablet Take 1 tablet by mouth daily.   naproxen sodium (ALEVE) 220 MG tablet Take 220 mg by mouth 2 (two) times daily as needed (pain).   niacinamide  100 MG tablet Take 1 tablet (100 mg total) by mouth 2 (two) times daily with a meal.   ofloxacin (OCUFLOX) 0.3 % ophthalmic solution Place 2 drops into both eyes daily as needed (infection).   olmesartan  (BENICAR ) 40 MG tablet Take 1 tablet (40 mg total) by mouth daily.   omeprazole  (PRILOSEC) 20 MG capsule Take 1 capsule (20 mg total) by mouth 2 (two) times daily before a meal.   potassium chloride  SA (KLOR-CON  M) 20 MEQ tablet Take 1 tablet (20 mEq total) by mouth daily.   tirzepatide  (ZEPBOUND ) 7.5 MG/0.5ML Pen Inject 7.5 mg into the skin once a week.   No current facility-administered medications on file prior to visit.    Allergies:  No Known Allergies  Social History:  Social History   Socioeconomic History   Marital status: Widowed    Spouse name: Not on file   Number of children: Not on file   Years of education: Not on file   Highest education level: Professional school degree (e.g., MD, DDS, DVM, JD)  Occupational History   Not on file  Tobacco Use   Smoking status: Never   Smokeless tobacco: Never  Substance and Sexual Activity   Alcohol use: Yes    Comment: occasional   Drug use: Never   Sexual activity: Not on file  Other Topics Concern   Not on file  Social History Narrative   Not on file   Social Drivers of Health   Financial Resource Strain: Low Risk  (08/13/2024)   Overall Financial Resource Strain (CARDIA)    Difficulty of Paying Living Expenses: Not hard at all  Food Insecurity: No Food Insecurity (08/13/2024)    Hunger Vital Sign    Worried About Running Out of Food in the Last Year: Never true    Ran Out of Food in the Last Year: Never true  Transportation Needs: No Transportation Needs (08/13/2024)   PRAPARE - Administrator, Civil Service (Medical): No    Lack of Transportation (Non-Medical): No  Physical Activity: Insufficiently Active (08/13/2024)   Exercise Vital Sign    Days of Exercise per Week: 2 days    Minutes of Exercise per Session: 20 min  Stress: No Stress Concern Present (08/13/2024)   [employer] of Occupational Health - Occupational Stress Questionnaire    Feeling of Stress: Not at all  Social Connections: Moderately Isolated (08/13/2024)  Social Connection and Isolation Panel    Frequency of Communication with Friends and Family: More than three times a week    Frequency of Social Gatherings with Friends and Family: More than three times a week    Attends Religious Services: 1 to 4 times per year    Active Member of [employer] or Organizations: No    Attends Banker Meetings: Not on file    Marital Status: Widowed  Intimate Partner Violence: Not At Risk (04/25/2023)   Humiliation, Afraid, Rape, and Kick questionnaire    Fear of Current or Ex-Partner: No    Emotionally Abused: No    Physically Abused: No    Sexually Abused: No   Social History   Tobacco Use  Smoking Status Never  Smokeless Tobacco Never   Social History   Substance and Sexual Activity  Alcohol Use Yes   Comment: occasional    Family History:  Family History  Problem Relation Age of Onset   Hypertension Mother    Cancer Mother        skin cancer   Hypertension Father    Cancer Father        skin   Cancer Sister        skin cancer    Past medical history, surgical history, medications, allergies, family history and social history reviewed with patient today and changes made to appropriate areas of the chart.   Review of Systems  Constitutional:  Positive for  chills and malaise/fatigue. Negative for diaphoresis, fever and weight loss.  HENT: Negative.    Eyes: Negative.   Respiratory: Negative.    Cardiovascular: Negative.   Gastrointestinal:  Positive for constipation. Negative for abdominal pain, blood in stool, diarrhea, heartburn, melena, nausea and vomiting.  Genitourinary: Negative.   Musculoskeletal:  Positive for joint pain and myalgias. Negative for back pain, falls and neck pain.  Skin: Negative.   Neurological: Negative.   Endo/Heme/Allergies:  Negative for environmental allergies and polydipsia. Bruises/bleeds easily.  Psychiatric/Behavioral: Negative.     All other ROS negative except what is listed above and in the HPI.      Objective:    BP (!) 169/90   Pulse 82   Temp 98 F (36.7 C) (Oral)   Ht 6' (1.829 m)   Wt 245 lb (111.1 kg)   SpO2 95%   BMI 33.23 kg/m   Wt Readings from Last 3 Encounters:  11/19/24 245 lb (111.1 kg)  10/02/24 268 lb (121.6 kg)  09/29/24 269 lb (122 kg)    Physical Exam Vitals and nursing note reviewed.  Constitutional:      General: He is not in acute distress.    Appearance: Normal appearance. He is obese. He is not ill-appearing, toxic-appearing or diaphoretic.  HENT:     Head: Normocephalic and atraumatic.     Right Ear: Tympanic membrane, ear canal and external ear normal. There is no impacted cerumen.     Left Ear: Tympanic membrane, ear canal and external ear normal. There is no impacted cerumen.     Nose: Nose normal. No congestion or rhinorrhea.     Mouth/Throat:     Mouth: Mucous membranes are moist.     Pharynx: Oropharynx is clear. No oropharyngeal exudate or posterior oropharyngeal erythema.  Eyes:     General: No scleral icterus.       Right eye: No discharge.        Left eye: No discharge.     Extraocular Movements: Extraocular  movements intact.     Conjunctiva/sclera: Conjunctivae normal.     Pupils: Pupils are equal, round, and reactive to light.  Neck:      Vascular: No carotid bruit.  Cardiovascular:     Rate and Rhythm: Normal rate and regular rhythm.     Pulses: Normal pulses.     Heart sounds: No murmur heard.    No friction rub. No gallop.  Pulmonary:     Effort: Pulmonary effort is normal. No respiratory distress.     Breath sounds: Normal breath sounds. No stridor. No wheezing, rhonchi or rales.  Chest:     Chest wall: No tenderness.  Abdominal:     General: Abdomen is flat. Bowel sounds are normal. There is no distension.     Palpations: Abdomen is soft. There is no mass.     Tenderness: There is no abdominal tenderness. There is no right CVA tenderness, left CVA tenderness, guarding or rebound.     Hernia: No hernia is present.  Genitourinary:    Comments: Genital exam deferred with shared decision making Musculoskeletal:        General: No swelling, tenderness, deformity or signs of injury.     Cervical back: Normal range of motion and neck supple. No rigidity. No muscular tenderness.     Right lower leg: No edema.     Left lower leg: No edema.  Lymphadenopathy:     Cervical: No cervical adenopathy.  Skin:    General: Skin is warm and dry.     Capillary Refill: Capillary refill takes less than 2 seconds.     Coloration: Skin is not jaundiced or pale.     Findings: No bruising, erythema, lesion or rash.  Neurological:     General: No focal deficit present.     Mental Status: He is alert and oriented to person, place, and time.     Cranial Nerves: No cranial nerve deficit.     Sensory: No sensory deficit.     Motor: No weakness.     Coordination: Coordination normal.     Gait: Gait normal.     Deep Tendon Reflexes: Reflexes normal.  Psychiatric:        Mood and Affect: Mood normal.        Behavior: Behavior normal.        Thought Content: Thought content normal.        Judgment: Judgment normal.      Results for orders placed or performed during the hospital encounter of 10/21/24  Glucose, capillary   Collection  Time: 10/21/24  8:10 AM  Result Value Ref Range   Glucose-Capillary 96 70 - 99 mg/dL      Assessment & Plan:   Problem List Items Addressed This Visit       Cardiovascular and Mediastinum   Hypertension   Not under good control. Will add amlodipine and recheck in 3 months. Call with any concerns.       Relevant Medications   amLODipine (NORVASC) 2.5 MG tablet   Other Relevant Orders   Comprehensive metabolic panel with GFR   CBC with Differential/Platelet   TSH   Microalbumin, Urine Waived   Aortic atherosclerosis   Will keep his BP and cholesterol under good control. Continue to monitor. Call with any concerns.       Relevant Medications   amLODipine (NORVASC) 2.5 MG tablet   Pulmonary hypertension, primary (HCC)   Found incidentally on imaging. Will keep BP under good control. Continue to  monitor. Call with any concerns.       Relevant Medications   amLODipine  (NORVASC ) 2.5 MG tablet   Senile purpura   Reassured patient. Call with any concerns.       Relevant Medications   amLODipine  (NORVASC ) 2.5 MG tablet     Respiratory   OSA on CPAP   Congratulated patient on 45lb weight loss on zepbound ! Will push the dose and recheck in 3 months. Call with any concerns.       Relevant Orders   Comprehensive metabolic panel with GFR   CBC with Differential/Platelet   Hiatal hernia   Found incidentally on imaging. Continue to monitor. Call with any concerns.         Endocrine   IFG (impaired fasting glucose)   Rechecking labs today. Await results. Treat as needed.       Relevant Orders   Bayer DCA Hb A1c Waived     Musculoskeletal and Integument   Squamous cell carcinoma of face   Scheduled for Mohs surgery in the new year. Continue to follow with dermatology. Call with any concerns.         Genitourinary   CKD stage 3a, GFR 45-59 ml/min (HCC)   Rechecking labs today. Await results. Treat as needed.         Other   Obesity (BMI 30-39.9)   Down 41lbs  with zepbound ! Feeling well. Continue to monitor. Call with any concerns.       Relevant Medications   tirzepatide  (ZEPBOUND ) 10 MG/0.5ML Pen   Other Relevant Orders   Comprehensive metabolic panel with GFR   CBC with Differential/Platelet   History of parotid cancer   Relevant Orders   PTH, Intact and Calcium   Idiopathic chronic gout without tophus   Rechecking labs today. Await results. Treat as needed.       Relevant Orders   Comprehensive metabolic panel with GFR   CBC with Differential/Platelet   Uric acid   Pure hypercholesterolemia   Rechecking labs today. Await results. Treat as needed.       Relevant Medications   amLODipine  (NORVASC ) 2.5 MG tablet   Other Relevant Orders   Lipid Panel w/o Chol/HDL Ratio   Other Visit Diagnoses       Encounter for annual wellness exam in Medicare patient    -  Primary   Preventative care discussed today as below. Call with any concerns.     Routine general medical examination at a health care facility       Vaccines up to date. Screening labs checked today. Colonoscopy ordered today. Continue diet and exercise. Call with any concerns.     Irregular heart beat       Heard on exam- + PVCs. Continue to monitor.   Relevant Orders   EKG 12-Lead     Screening for HIV (human immunodeficiency virus)       Labs drawn today. Await results. Treat as needed.   Relevant Orders   HIV Antibody (routine testing w rflx)     Need for hepatitis C screening test       Labs drawn today. Await results. Treat as needed.   Relevant Orders   Hepatitis C Antibody     Hesitancy       Labs drawn today. Await results. Treat as needed.   Relevant Orders   PSA     Screening for colon cancer       Referral to GI placed today.   Relevant Orders  Ambulatory referral to Gastroenterology        Preventative Services:  Health Risk Assessment and Personalized Prevention Plan: Done today Bone Mass Measurements: N/A CVD Screening: Done today Colon  Cancer Screening:  Depression Screening: Done today Diabetes Screening: Done today Glaucoma Screening: see your eye doctor Hepatitis B vaccine: N/A Hepatitis C screening: Done today HIV Screening:done today Flu Vaccine: up to date Lung cancer Screening: N/A Obesity Screening: Done today Pneumonia Vaccines (2): up to date STI Screening: N/A PSA screening: Done today  Discussed aspirin prophylaxis for myocardial infarction prevention and decision was it was not indicated  LABORATORY TESTING:  Health maintenance labs ordered today as discussed above.   The natural history of prostate cancer and ongoing controversy regarding screening and potential treatment outcomes of prostate cancer has been discussed with the patient. The meaning of a false positive PSA and a false negative PSA has been discussed. He indicates understanding of the limitations of this screening test and wishes to proceed with screening PSA testing.   IMMUNIZATIONS:   - Tdap: Tetanus vaccination status reviewed: last tetanus booster within 10 years. - Influenza: Up to date - Prevnar: Up to date - Zostavax vaccine: Given elsewhere  SCREENING: - Colonoscopy: Ordered today  Discussed with patient purpose of the colonoscopy is to detect colon cancer at curable precancerous or early stages   PATIENT COUNSELING:    Sexuality: Discussed sexually transmitted diseases, partner selection, use of condoms, avoidance of unintended pregnancy  and contraceptive alternatives.   Advised to avoid cigarette smoking.  I discussed with the patient that most people either abstain from alcohol or drink within safe limits (<=14/week and <=4 drinks/occasion for males, <=7/weeks and <= 3 drinks/occasion for females) and that the risk for alcohol disorders and other health effects rises proportionally with the number of drinks per week and how often a drinker exceeds daily limits.  Discussed cessation/primary prevention of drug use and  availability of treatment for abuse.   Diet: Encouraged to adjust caloric intake to maintain  or achieve ideal body weight, to reduce intake of dietary saturated fat and total fat, to limit sodium intake by avoiding high sodium foods and not adding table salt, and to maintain adequate dietary potassium and calcium preferably from fresh fruits, vegetables, and low-fat dairy products.    stressed the importance of regular exercise  Injury prevention: Discussed safety belts, safety helmets, smoke detector, smoking near bedding or upholstery.   Dental health: Discussed importance of regular tooth brushing, flossing, and dental visits.   Follow up plan: NEXT PREVENTATIVE PHYSICAL DUE IN 1 YEAR. Return in about 3 months (around 02/17/2025).

## 2024-11-19 NOTE — Assessment & Plan Note (Signed)
 Not under good control. Will add amlodipine and recheck in 3 months. Call with any concerns.

## 2024-11-19 NOTE — Assessment & Plan Note (Signed)
Reassured patient. Call with any concerns.  

## 2024-11-20 ENCOUNTER — Ambulatory Visit: Payer: Self-pay | Admitting: Family Medicine

## 2024-11-20 ENCOUNTER — Encounter: Payer: Self-pay | Admitting: Oncology

## 2024-11-20 DIAGNOSIS — R972 Elevated prostate specific antigen [PSA]: Secondary | ICD-10-CM

## 2024-11-20 LAB — CBC WITH DIFFERENTIAL/PLATELET

## 2024-11-21 ENCOUNTER — Encounter: Payer: Self-pay | Admitting: Oncology

## 2024-11-21 LAB — CBC WITH DIFFERENTIAL/PLATELET
Basos: 1 %
EOS (ABSOLUTE): 0 x10E3/uL (ref 0.0–0.2)
Eos: 2 %
Hematocrit: 46.5 % (ref 37.5–51.0)
Hemoglobin: 15.6 g/dL (ref 13.0–17.7)
Immature Granulocytes: 0 %
Immature Granulocytes: 0 x10E3/uL (ref 0.0–0.1)
Lymphs: 8 %
MCH: 32 pg (ref 26.6–33.0)
MCHC: 33.5 g/dL (ref 31.5–35.7)
MCV: 96 fL (ref 79–97)
Monocytes Absolute: 0.1 x10E3/uL (ref 0.0–0.4)
Monocytes Absolute: 0.7 x10E3/uL (ref 0.1–0.9)
Monocytes: 9 %
Neutrophils Absolute: 0.6 x10E3/uL — AB (ref 0.7–3.1)
Neutrophils Absolute: 6.3 x10E3/uL (ref 1.4–7.0)
Neutrophils: 80 %
Platelets: 272 x10E3/uL (ref 150–450)
RBC: 4.87 x10E6/uL (ref 4.14–5.80)
RDW: 13.2 % (ref 11.6–15.4)
WBC: 7.8 x10E3/uL (ref 3.4–10.8)

## 2024-11-21 LAB — COMPREHENSIVE METABOLIC PANEL WITH GFR
ALT: 20 IU/L (ref 0–44)
AST: 23 IU/L (ref 0–40)
Albumin: 4.5 g/dL (ref 3.9–4.9)
Alkaline Phosphatase: 149 IU/L — ABNORMAL HIGH (ref 47–123)
BUN/Creatinine Ratio: 15 (ref 10–24)
BUN: 17 mg/dL (ref 8–27)
Bilirubin Total: 0.8 mg/dL (ref 0.0–1.2)
CO2: 24 mmol/L (ref 20–29)
Calcium: 9.7 mg/dL (ref 8.6–10.2)
Chloride: 98 mmol/L (ref 96–106)
Creatinine, Ser: 1.1 mg/dL (ref 0.76–1.27)
Globulin, Total: 2.5 g/dL (ref 1.5–4.5)
Glucose: 90 mg/dL (ref 70–99)
Potassium: 4 mmol/L (ref 3.5–5.2)
Sodium: 140 mmol/L (ref 134–144)
Total Protein: 7 g/dL (ref 6.0–8.5)
eGFR: 72 mL/min/1.73 (ref >59)

## 2024-11-21 LAB — PSA: Prostate Specific Ag, Serum: 15 ng/mL — ABNORMAL HIGH (ref 0.0–4.0)

## 2024-11-21 LAB — HEPATITIS C ANTIBODY: Hep C Virus Ab: NONREACTIVE

## 2024-11-21 LAB — LIPID PANEL W/O CHOL/HDL RATIO
Cholesterol, Total: 137 mg/dL (ref 100–199)
HDL: 41 mg/dL (ref >39)
LDL Chol Calc (NIH): 77 mg/dL (ref 0–99)
Triglycerides: 105 mg/dL (ref 0–149)
VLDL Cholesterol Cal: 19 mg/dL (ref 5–40)

## 2024-11-21 LAB — TSH: TSH: 2.2 u[IU]/mL (ref 0.450–4.500)

## 2024-11-21 LAB — PTH, INTACT AND CALCIUM: PTH: 24 pg/mL (ref 15–65)

## 2024-11-21 LAB — URIC ACID: Uric Acid: 5.8 mg/dL (ref 3.8–8.4)

## 2024-11-21 LAB — HIV ANTIBODY (ROUTINE TESTING W REFLEX): HIV Screen 4th Generation wRfx: NONREACTIVE

## 2024-11-21 NOTE — Telephone Encounter (Signed)
 Requested Prescriptions  Pending Prescriptions Disp Refills   omeprazole  (PRILOSEC) 20 MG capsule [Pharmacy Med Name: OMEPRAZOLE  DR 20 MG CAP] 180 capsule 3    Sig: TAKE 1 CAPSULE (20 MG) BY MOUTH TWICE DAILY BEFORE A MEAL     Gastroenterology: Proton Pump Inhibitors Passed - 11/21/2024  9:29 PM      Passed - Valid encounter within last 12 months    Recent Outpatient Visits           2 days ago Encounter for annual wellness exam in Medicare patient   [place] [facility] [place], Megan P, DO   1 month ago OSA on CPAP   [facility] [place], [place], DO   3 months ago Primary hypertension   [place] [facility] [place], [place], DO

## 2024-12-11 ENCOUNTER — Encounter: Payer: Self-pay | Admitting: Oncology

## 2024-12-11 ENCOUNTER — Telehealth: Payer: Self-pay | Admitting: Pharmacy Technician

## 2024-12-11 ENCOUNTER — Other Ambulatory Visit: Payer: Self-pay

## 2024-12-11 NOTE — Telephone Encounter (Signed)
 Pharmacy Patient Advocate Encounter  Received notification from [employer] that Prior Authorization for Zepbound  2.5MG /0.5ML pen-injectors has been APPROVED from 09/16/2024 to 12/16/2025.   PA #/Case ID/Reference #: [id number]

## 2024-12-22 ENCOUNTER — Encounter: Payer: Self-pay | Admitting: Family Medicine

## 2024-12-22 ENCOUNTER — Ambulatory Visit: Admitting: Urology

## 2024-12-22 ENCOUNTER — Encounter: Payer: Self-pay | Admitting: Urology

## 2024-12-22 VITALS — BP 155/93 | HR 85 | Ht 72.0 in | Wt 229.0 lb

## 2024-12-22 DIAGNOSIS — R972 Elevated prostate specific antigen [PSA]: Secondary | ICD-10-CM

## 2024-12-22 LAB — URINALYSIS, COMPLETE
Glucose, UA: NEGATIVE
Leukocytes,UA: NEGATIVE
Nitrite, UA: NEGATIVE
RBC, UA: NEGATIVE
Specific Gravity, UA: 1.01 (ref 1.005–1.030)
Urobilinogen, Ur: 0.2 mg/dL (ref 0.2–1.0)
pH, UA: 7 (ref 5.0–7.5)

## 2024-12-22 LAB — MICROSCOPIC EXAMINATION

## 2024-12-22 NOTE — Telephone Encounter (Signed)
 Scheduled

## 2024-12-22 NOTE — Progress Notes (Signed)
 "  12/22/2024 8:20 AM   [name] [birth year] [id number]  Referring provider: Vicci Duwaine SQUIBB, DO [address] [place],  [place] [address]  Chief Complaint  Patient presents with   Elevated PSA    HPI: Douglas Daugherty is a 69 y.o. male referred for evaluation of an elevated PSA.  PSA levels: 15.0 on 11/19/2024 Baseline PSA level: Not available Lower urinary tract symptoms: None Family history prostate cancer first-degree relatives: None Past urologic history: None Previous PCP was Dr. Corlis.  He states he has a history of an elevated PSA but those records are not available for review.  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 If you have any questions, ask your nurse or doctor.          acetaminophen  500 MG tablet Commonly known as: TYLENOL  Take 1,000 mg by mouth every 6 (six) hours as needed for moderate pain or headache.   allopurinol  100 MG tablet Commonly known as: ZYLOPRIM  Take 1 tablet (100 mg total) by mouth daily.   amLODipine  2.5 MG tablet Commonly known as: NORVASC  Take 1 tablet (2.5 mg total) by mouth daily.   ARTIFICIAL TEARS OP Place 1 drop into both eyes daily as needed (dry eyes).   carvedilol  25  MG tablet Commonly known as: COREG  Take 1 tablet (25 mg total) by mouth 2 (two) times daily.   colchicine  0.6 MG tablet TAKE ONE TABLET (0.6 MG) BY MOUTH TWICE DAILY AS NEEDED FOR GOUT FLARE   LORazepam  0.5 MG tablet Commonly known as: ATIVAN  Take 0.5 mg by mouth at bedtime as needed for sleep.   Magnesium  Glycinate 100 MG Caps Take 2 tablets by mouth daily.   multivitamin with minerals Tabs tablet Take 1 tablet by mouth daily.   naproxen sodium 220 MG tablet Commonly known as: ALEVE Take 220 mg by mouth 2 (two) times daily as needed (pain).   niacinamide  100 MG tablet Take 1 tablet (100 mg total) by mouth 2 (two) times daily with a meal.   ofloxacin 0.3 % ophthalmic solution Commonly known as: OCUFLOX Place 2 drops into both eyes daily as needed (infection).   olmesartan  40 MG tablet Commonly known as: BENICAR  Take 1 tablet (40 mg total) by mouth daily.   omeprazole  20 MG capsule Commonly known as: PRILOSEC TAKE 1 CAPSULE (20 MG) BY MOUTH TWICE DAILY BEFORE A MEAL   potassium chloride  SA 20 MEQ tablet Commonly known as: KLOR-CON  M Take 1 tablet (20 mEq total) by mouth daily.   Zepbound  10 MG/0.5ML Pen Generic drug: tirzepatide  Inject 10 mg into the skin once  a week. What changed: Another medication with the same name was removed. Continue taking this medication, and follow the directions you see here. Changed by: Glendia Barba, MD        Allergies: Allergies[1]  Family History: Family History  Problem Relation Age of Onset   Hypertension Mother    Cancer Mother        skin cancer   Hypertension Father    Cancer Father        skin   Cancer Sister        skin cancer    Social History:  reports that he has never smoked. He has never used smokeless tobacco. He reports current alcohol use. He reports that he does not use drugs.   Physical Exam: BP (!) 155/93   Pulse 85   Ht 6' (1.829 m)   Wt 229 lb (103.9 kg)   BMI 31.06 kg/m   Constitutional:   Alert and oriented, No acute distress. HEENT: [place] AT Respiratory: Normal respiratory effort, no increased work of breathing. GU: Prostate 35 g, smooth with abnormal firmness left prostate Psychiatric: Normal mood and affect.   Assessment & Plan:    1.  Elevated PSA Abnormal DRE UA ordered Repeat PSA/PHI today If persistently abnormal recommend prostate biopsy.  The procedure was discussed including potential risks of bleeding and infection/sepsis.  All questions were answered   Glendia JAYSON Barba, MD  [facility] [address], [address] [place], [place] [address] [phone]     [1] No Known Allergies  "

## 2024-12-22 NOTE — Telephone Encounter (Signed)
 Appt- virtual OK, we've never written lorazepam  for him.

## 2024-12-23 ENCOUNTER — Telehealth: Admitting: Family Medicine

## 2024-12-23 ENCOUNTER — Other Ambulatory Visit: Payer: Self-pay

## 2024-12-23 ENCOUNTER — Encounter: Payer: Self-pay | Admitting: Family Medicine

## 2024-12-23 VITALS — Ht 72.0 in | Wt 228.6 lb

## 2024-12-23 DIAGNOSIS — Z79899 Other long term (current) drug therapy: Secondary | ICD-10-CM

## 2024-12-23 DIAGNOSIS — G4733 Obstructive sleep apnea (adult) (pediatric): Secondary | ICD-10-CM

## 2024-12-23 DIAGNOSIS — F5102 Adjustment insomnia: Secondary | ICD-10-CM | POA: Diagnosis not present

## 2024-12-23 MED ORDER — ZEPBOUND 12.5 MG/0.5ML ~~LOC~~ SOAJ
12.5000 mg | SUBCUTANEOUS | 2 refills | Status: DC
  Filled 2024-12-23: qty 2, 28d supply, fill #0

## 2024-12-23 MED ORDER — LORAZEPAM 0.5 MG PO TABS: 0.5000 mg | ORAL_TABLET | Freq: Every evening | ORAL | 1 refills | Status: AC | PRN

## 2024-12-23 MED ORDER — ZEPBOUND 12.5 MG/0.5ML ~~LOC~~ SOAJ
12.5000 mg | SUBCUTANEOUS | 2 refills | Status: AC
  Filled 2024-12-23: qty 2, 28d supply, fill #0

## 2024-12-23 NOTE — Assessment & Plan Note (Signed)
 Congratulated patient on 59lb weight loss on zepbound . Will titrate up to 12.5mg  and follow up in March as scheduled.

## 2024-12-23 NOTE — Progress Notes (Signed)
 "  Ht 6' (1.829 m)   Wt 228 lb 9.6 oz (103.7 kg)   BMI 31.00 kg/m    Subjective:    Patient ID: Douglas Daugherty, male    DOB: [birth year], 69 y.o.   MRN: [id number]  HPI: Douglas Daugherty is a 69 y.o. male  Chief Complaint  Patient presents with   Obstructive Sleep Apnea    Would like Rx for 12.5mg   Zepbound     Insomnia    Would like refill of Ativan     SLEEP APNEA- down 59lbs with the zepbound . Tolerating his medicine well Sleep apnea status: controlled Duration: chronic Satisfied with current treatment?:  yes CPAP use:  yes Sleep quality with CPAP use: excellent Treament compliance:excellent compliance Last sleep study: couple of years ago Treatments attempted: CPAP, zepbound  Wakes feeling refreshed:  yes Daytime hypersomnolence:  no Fatigue:  yes Insomnia:  no Good sleep hygiene:  yes Difficulty falling asleep:  no Difficulty staying asleep:  no Snoring bothers bed partner:  yes Observed apnea by bed partner: yes Obesity:  yes Hypertension: yes  Pulmonary hypertension:  no Coronary artery disease:  no  INSOMNIA Duration: chronic Satisfied with sleep quality: yes Difficulty falling asleep: yes Difficulty staying asleep: yes Waking a few hours after sleep onset: no Early morning awakenings: no Daytime hypersomnolence: no Wakes feeling refreshed: yes Good sleep hygiene: yes Apnea: yes Snoring: yes Depressed/anxious mood: no Recent stress: yes Restless legs/nocturnal leg cramps: no Chronic pain/arthritis: yes History of sleep study: yes Treatments attempted: none    Relevant past medical, surgical, family and social history reviewed and updated as indicated. Interim medical history since our last visit reviewed. Allergies and medications reviewed and updated.  Review of Systems  Constitutional: Negative.   Respiratory: Negative.    Cardiovascular: Negative.   Musculoskeletal: Negative.   Neurological: Negative.   Psychiatric/Behavioral: Negative.       Per HPI unless specifically indicated above     Objective:    Ht 6' (1.829 m)   Wt 228 lb 9.6 oz (103.7 kg)   BMI 31.00 kg/m   Wt Readings from Last 3 Encounters:  12/23/24 228 lb 9.6 oz (103.7 kg)  12/22/24 229 lb (103.9 kg)  11/19/24 245 lb (111.1 kg)    Physical Exam Vitals and nursing note reviewed.  Constitutional:      General: He is not in acute distress.    Appearance: Normal appearance. He is not ill-appearing, toxic-appearing or diaphoretic.  HENT:     Head: Normocephalic and atraumatic.     Right Ear: External ear normal.     Left Ear: External ear normal.     Nose: Nose normal.     Mouth/Throat:     Mouth: Mucous membranes are moist.     Pharynx: Oropharynx is clear.  Eyes:     General: No scleral icterus.       Right eye: No discharge.        Left eye: No discharge.     Conjunctiva/sclera: Conjunctivae normal.     Pupils: Pupils are equal, round, and reactive to light.  Pulmonary:     Effort: Pulmonary effort is normal. No respiratory distress.     Comments: Speaking in full sentences Musculoskeletal:        General: Normal range of motion.     Cervical back: Normal range of motion.  Skin:    Coloration: Skin is not jaundiced or pale.     Findings: No bruising, erythema, lesion or rash.  Neurological:     Mental Status: He is alert and oriented to person, place, and time. Mental status is at baseline.  Psychiatric:        Mood and Affect: Mood normal.        Behavior: Behavior normal.        Thought Content: Thought content normal.        Judgment: Judgment normal.     Results for orders placed or performed in visit on 12/22/24  Microscopic Examination   Collection Time: 12/22/24  8:53 AM   Urine  Result Value Ref Range   WBC, UA 0-5 0 - 5 /hpf   RBC, Urine 3-10 (A) 0 - 2 /hpf   Epithelial Cells (non renal) 0-10 0 - 10 /hpf   Mucus, UA Present (A) Not Estab.   Bacteria, UA Few None seen/Few  Urinalysis, Complete   Collection Time:  12/22/24  8:53 AM  Result Value Ref Range   Specific Gravity, UA 1.010 1.005 - 1.030   pH, UA 7.0 5.0 - 7.5   Color, UA Yellow Yellow   Appearance Ur Clear Clear   Leukocytes,UA Negative Negative   Protein,UA 1+ (A) Negative/Trace   Glucose, UA Negative Negative   Ketones, UA Trace (A) Negative   RBC, UA Negative Negative   Bilirubin, UA Comment (A) Negative   Urobilinogen, Ur 0.2 0.2 - 1.0 mg/dL   Nitrite, UA Negative Negative   Microscopic Examination See below:       Assessment & Plan:   Problem List Items Addressed This Visit       Respiratory   OSA on CPAP - Primary   Congratulated patient on 59lb weight loss on zepbound . Will titrate up to 12.5mg  and follow up in March as scheduled.         Other   Controlled substance agreement signed   Adjustment insomnia   Has been on lorazepam  for sleep for many years. PMDP reviewed and appropriate. Controlled substance agreement signed. Encouraged him to take it as needed rather than nightly. Call with any concerns.         Follow up plan: Return for As scheduled.    This visit was completed via video visit through MyChart due to the restrictions of the COVID-19 pandemic. All issues as above were discussed and addressed. Physical exam was done as above through visual confirmation on video through MyChart. If it was felt that the patient should be evaluated in the office, they were directed there. The patient verbally consented to this visit. Location of the patient: home Location of the provider: work Those involved with this call:  Provider: Duwaine Louder, DO CMA: York Fogo, CMA, Front Desk/Registration: Claretta Maiden  Time spent on call: 25 minutes with patient face to face via video conference. More than 50% of this time was spent in counseling and coordination of care. 40 minutes total spent in review of patient's record and preparation of their chart.    "

## 2024-12-23 NOTE — Assessment & Plan Note (Signed)
 Has been on lorazepam  for sleep for many years. PMDP reviewed and appropriate. Controlled substance agreement signed. Encouraged him to take it as needed rather than nightly. Call with any concerns.

## 2024-12-24 LAB — REFLEX INFORMATION

## 2024-12-24 LAB — PROSTATE HEALTH INDEX: Prostate Specific Ag: 12 ng/mL — ABNORMAL HIGH (ref 0.0–3.9)

## 2024-12-25 ENCOUNTER — Ambulatory Visit: Payer: Self-pay | Admitting: Urology

## 2024-12-25 NOTE — Progress Notes (Signed)
 Notified patient as instructed,Sent my chart instructions also and verbally going over them .

## 2025-01-15 ENCOUNTER — Ambulatory Visit: Admitting: Urology

## 2025-01-15 VITALS — BP 132/73 | HR 91 | Ht 72.0 in | Wt 220.0 lb

## 2025-01-15 DIAGNOSIS — R972 Elevated prostate specific antigen [PSA]: Secondary | ICD-10-CM

## 2025-01-15 MED ORDER — GENTAMICIN SULFATE 40 MG/ML IJ SOLN
80.0000 mg | Freq: Once | INTRAMUSCULAR | Status: AC
  Administered 2025-01-15: 80 mg via INTRAMUSCULAR

## 2025-01-15 MED ORDER — LEVOFLOXACIN 500 MG PO TABS
500.0000 mg | ORAL_TABLET | Freq: Once | ORAL | Status: AC
  Administered 2025-01-15: 500 mg via ORAL

## 2025-01-15 NOTE — Progress Notes (Unsigned)
 Prostate Biopsy Procedure   Informed consent was obtained after discussing risks/benefits of the procedure.  A time out was performed to ensure correct patient identity.  Pre-Procedure: - Last PSA Level: No results found for: PSA - Gentamicin  given prophylactically - Levaquin  500 mg administered PO -Transrectal Ultrasound performed revealing a *** gm prostate -No significant hypoechoic or median lobe noted  Procedure: - Prostate block performed using 10 cc 1% lidocaine  and biopsies taken from sextant areas, a total of 12 under ultrasound guidance.  Post-Procedure: - Patient tolerated the procedure well - He was counseled to seek immediate medical attention if experiences any severe pain, significant bleeding, or fevers - Return in one week to discuss biopsy results

## 2025-01-27 ENCOUNTER — Ambulatory Visit: Admitting: Urology

## 2025-02-19 ENCOUNTER — Ambulatory Visit: Admitting: Family Medicine

## 2025-03-31 ENCOUNTER — Inpatient Hospital Stay

## 2025-03-31 ENCOUNTER — Inpatient Hospital Stay: Admitting: Oncology
